# Patient Record
Sex: Male | Born: 1970 | Race: White | Hispanic: No | Marital: Single
Health system: Southern US, Community
[De-identification: ages and names within clinical notes are randomized; demographics above are authoritative.]

## PROBLEM LIST (undated history)

## (undated) HISTORY — PX: CLEFT LIP REPAIR: SUR1164

## (undated) HISTORY — PX: NOSE SURGERY: SHX723

## (undated) HISTORY — PX: MYRINGOTOMY: SUR874

---

## 2000-05-04 ENCOUNTER — Encounter (INDEPENDENT_AMBULATORY_CARE_PROVIDER_SITE_OTHER): Payer: Self-pay | Admitting: Specialist

## 2000-05-04 ENCOUNTER — Ambulatory Visit (HOSPITAL_COMMUNITY): Admission: EM | Admit: 2000-05-04 | Discharge: 2000-05-04 | Payer: Self-pay | Admitting: Emergency Medicine

## 2000-11-30 ENCOUNTER — Emergency Department (HOSPITAL_COMMUNITY): Admission: EM | Admit: 2000-11-30 | Discharge: 2000-11-30 | Payer: Self-pay | Admitting: Emergency Medicine

## 2005-07-09 ENCOUNTER — Emergency Department (HOSPITAL_COMMUNITY): Admission: EM | Admit: 2005-07-09 | Discharge: 2005-07-09 | Payer: Self-pay | Admitting: Emergency Medicine

## 2007-05-15 ENCOUNTER — Emergency Department (HOSPITAL_COMMUNITY): Admission: EM | Admit: 2007-05-15 | Discharge: 2007-05-15 | Payer: Self-pay | Admitting: Family Medicine

## 2007-10-28 ENCOUNTER — Emergency Department (HOSPITAL_COMMUNITY): Admission: EM | Admit: 2007-10-28 | Discharge: 2007-10-28 | Payer: Self-pay | Admitting: Family Medicine

## 2011-05-05 NOTE — Op Note (Signed)
[facility]  Patient:    Adam Mcgrath, Adam Mcgrath                          MRN: [id number] Proc. Date: 05/04/00 Adm. Date:  [id number] Disc. Date: [id number] Attending:  Benny Lennert CC:         Juluis Mire, M.D.                           Operative Report  PREOPERATIVE DIAGNOSIS:  Large cyst of scalp.  POSTOPERATIVE DIAGNOSIS:  Large cyst of scalp.  OPERATION PERFORMED:  Excision of scalp cyst.  SURGEON:  Marnee Spring. Wiliam Ke, M.D.  ASSISTANT:  None.  ANESTHESIA:  Local MAC.  DESCRIPTION OF PROCEDURE:  With the patient somewhat sedated lying on his stomach, the skin of the posterior scalp was prepped and draped in the usual manner.  An excision of a sizable ellipse of skin in the midst of the large cyst was made in a vertical direction on the parietal scalp.  This was deepened through subcutaneous tissues.  Tissues had been infiltrated with a mixture of Xylocaine and Marcaine anesthesia.  The scalp cyst was excised in total intact and hemostasis obtained with electrocautery current.  The wound was then closed with horizontal mattress sutures of 3-0 nylon.  It was dressed with collodion.  Estimated blood loss for this procedure was minimal.  The patient received no blood and left the operating room in stable condition after sponge and needle counts were verified. DD:  05/04/00 TD:  05/09/00 Job: [id number]

## 2011-07-04 ENCOUNTER — Ambulatory Visit (INDEPENDENT_AMBULATORY_CARE_PROVIDER_SITE_OTHER): Payer: Self-pay

## 2011-07-04 ENCOUNTER — Inpatient Hospital Stay (INDEPENDENT_AMBULATORY_CARE_PROVIDER_SITE_OTHER)
Admission: RE | Admit: 2011-07-04 | Discharge: 2011-07-04 | Disposition: A | Discharge status: Home / Self Care | Payer: Self-pay | Source: Ambulatory Visit | Attending: Family Medicine | Admitting: Family Medicine

## 2011-07-04 DIAGNOSIS — S20229A Contusion of unspecified back wall of thorax, initial encounter: Secondary | ICD-10-CM

## 2013-08-10 ENCOUNTER — Encounter (HOSPITAL_COMMUNITY): Payer: Self-pay | Admitting: Cardiology

## 2013-08-10 ENCOUNTER — Emergency Department (HOSPITAL_COMMUNITY): Payer: Self-pay

## 2013-08-10 ENCOUNTER — Emergency Department (INDEPENDENT_AMBULATORY_CARE_PROVIDER_SITE_OTHER)
Admission: EM | Admit: 2013-08-10 | Discharge: 2013-08-10 | Disposition: A | Discharge status: Nursing Home (Includes ICF) | Payer: Self-pay | Source: Home / Self Care | Attending: Emergency Medicine | Admitting: Emergency Medicine

## 2013-08-10 ENCOUNTER — Emergency Department (HOSPITAL_COMMUNITY)
Admission: EM | Admit: 2013-08-10 | Discharge: 2013-08-10 | Disposition: A | Discharge status: Home / Self Care | Payer: Self-pay | Attending: Emergency Medicine | Admitting: Emergency Medicine

## 2013-08-10 ENCOUNTER — Encounter (HOSPITAL_COMMUNITY): Payer: Self-pay | Admitting: *Deleted

## 2013-08-10 DIAGNOSIS — R112 Nausea with vomiting, unspecified: Secondary | ICD-10-CM | POA: Insufficient documentation

## 2013-08-10 DIAGNOSIS — S0990XA Unspecified injury of head, initial encounter: Secondary | ICD-10-CM | POA: Insufficient documentation

## 2013-08-10 DIAGNOSIS — M549 Dorsalgia, unspecified: Secondary | ICD-10-CM

## 2013-08-10 DIAGNOSIS — R519 Headache, unspecified: Secondary | ICD-10-CM

## 2013-08-10 DIAGNOSIS — R51 Headache: Secondary | ICD-10-CM

## 2013-08-10 DIAGNOSIS — IMO0002 Reserved for concepts with insufficient information to code with codable children: Secondary | ICD-10-CM | POA: Insufficient documentation

## 2013-08-10 DIAGNOSIS — Y92410 Unspecified street and highway as the place of occurrence of the external cause: Secondary | ICD-10-CM | POA: Insufficient documentation

## 2013-08-10 DIAGNOSIS — Y9389 Activity, other specified: Secondary | ICD-10-CM | POA: Insufficient documentation

## 2013-08-10 DIAGNOSIS — F17200 Nicotine dependence, unspecified, uncomplicated: Secondary | ICD-10-CM | POA: Insufficient documentation

## 2013-08-10 DIAGNOSIS — R0602 Shortness of breath: Secondary | ICD-10-CM

## 2013-08-10 LAB — COMPREHENSIVE METABOLIC PANEL
Albumin: 3.9 g/dL (ref 3.5–5.2)
BUN: 7 mg/dL (ref 6–23)
CO2: 27 mEq/L (ref 19–32)
Chloride: 102 mEq/L (ref 96–112)
Creatinine, Ser: 0.82 mg/dL (ref 0.50–1.35)
GFR calc Af Amer: >90 mL/min (ref >90)
GFR calc non Af Amer: >90 mL/min (ref >90)
Glucose, Bld: 99 mg/dL (ref 70–99)
Total Bilirubin: 0.3 mg/dL (ref 0.3–1.2)

## 2013-08-10 LAB — CBC WITH DIFFERENTIAL/PLATELET
Basophils Relative: 0 % (ref 0–1)
HCT: 40.7 % (ref 39.0–52.0)
Hemoglobin: 13.7 g/dL (ref 13.0–17.0)
MCH: 31.3 pg (ref 26.0–34.0)
MCHC: 33.7 g/dL (ref 30.0–36.0)
Monocytes Absolute: 0.5 10*3/uL (ref 0.1–1.0)
Monocytes Relative: 5 % (ref 3–12)
Neutro Abs: 7.4 10*3/uL (ref 1.7–7.7)

## 2013-08-10 LAB — URINALYSIS, ROUTINE W REFLEX MICROSCOPIC
Bilirubin Urine: NEGATIVE
Glucose, UA: NEGATIVE mg/dL
Ketones, ur: NEGATIVE mg/dL
Leukocytes, UA: NEGATIVE
pH: 6.5 (ref 5.0–8.0)

## 2013-08-10 MED ORDER — OXYCODONE-ACETAMINOPHEN 5-325 MG PO TABS
2.0000 | ORAL_TABLET | Freq: Once | ORAL | Status: AC
  Administered 2013-08-10: 2 via ORAL
  Filled 2013-08-10: qty 2

## 2013-08-10 MED ORDER — METHOCARBAMOL 500 MG PO TABS: 500.0000 mg | ORAL_TABLET | Freq: Two times a day (BID) | ORAL | Status: DC

## 2013-08-10 MED ORDER — KETOROLAC TROMETHAMINE 30 MG/ML IJ SOLN
30.0000 mg | Freq: Once | INTRAMUSCULAR | Status: AC
  Administered 2013-08-10: 30 mg via INTRAVENOUS
  Filled 2013-08-10: qty 1

## 2013-08-10 MED ORDER — IBUPROFEN 600 MG PO TABS: 600.0000 mg | ORAL_TABLET | Freq: Four times a day (QID) | ORAL | Status: DC | PRN

## 2013-08-10 MED ORDER — HYDROCODONE-ACETAMINOPHEN 5-325 MG PO TABS: 2.0000 | ORAL_TABLET | Freq: Four times a day (QID) | ORAL | Status: DC | PRN

## 2013-08-10 MED ORDER — IOHEXOL 300 MG/ML  SOLN
100.0000 mL | Freq: Once | INTRAMUSCULAR | Status: AC | PRN
  Administered 2013-08-10: 100 mL via INTRAVENOUS
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
## 2013-08-10 NOTE — ED Notes (Signed)
PA at bedside.

## 2013-08-10 NOTE — ED Notes (Signed)
Asked for urine sample.
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
## 2013-08-10 NOTE — ED Notes (Signed)
Patient transported to X-ray 

## 2013-08-10 NOTE — ED Notes (Signed)
Patient returned from X-ray 
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
## 2013-08-10 NOTE — ED Provider Notes (Signed)
Medical screening examination/treatment/procedure(s) were performed by non-physician practitioner and as supervising physician I was immediately available for consultation/collaboration.  Leslee Home, M.D.  Reuben Likes, MD 08/10/13 [phone]

## 2013-08-10 NOTE — ED Notes (Signed)
Dahlia Client PA talked with patient; now agreeing to finish scans and xrays. Notified radiology that he needs to be scanned.

## 2013-08-10 NOTE — ED Notes (Addendum)
Radiology called - Pt is refusing C spine xray and CT of head. Micheline Maze, MD notified.

## 2013-08-11 NOTE — ED Provider Notes (Signed)
Medical screening examination/treatment/procedure(s) were performed by non-physician practitioner and as supervising physician I was immediately available for consultation/collaboration.   Shanna Cisco, MD 08/11/13 1221

## 2021-04-10 ENCOUNTER — Emergency Department (HOSPITAL_COMMUNITY): Payer: Medicaid Other | Admitting: Certified Registered"

## 2021-04-10 ENCOUNTER — Encounter (HOSPITAL_COMMUNITY)
Admission: EM | Disposition: A | Discharge status: Home / Self Care | Payer: Self-pay | Source: Home / Self Care | Attending: Orthopaedic Surgery

## 2021-04-10 ENCOUNTER — Emergency Department (HOSPITAL_COMMUNITY): Payer: Medicaid Other

## 2021-04-10 ENCOUNTER — Encounter (HOSPITAL_COMMUNITY): Payer: Self-pay | Admitting: Surgery

## 2021-04-10 ENCOUNTER — Inpatient Hospital Stay (HOSPITAL_COMMUNITY)
Admission: EM | Admit: 2021-04-10 | Discharge: 2021-04-11 | DRG: 481 | Disposition: A | Discharge status: Home / Self Care | Payer: Medicaid Other | Attending: Orthopaedic Surgery | Admitting: Orthopaedic Surgery

## 2021-04-10 DIAGNOSIS — S7221XA Displaced subtrochanteric fracture of right femur, initial encounter for closed fracture: Secondary | ICD-10-CM | POA: Diagnosis present

## 2021-04-10 DIAGNOSIS — D62 Acute posthemorrhagic anemia: Secondary | ICD-10-CM | POA: Diagnosis not present

## 2021-04-10 DIAGNOSIS — Z8781 Personal history of (healed) traumatic fracture: Secondary | ICD-10-CM

## 2021-04-10 DIAGNOSIS — S72141A Displaced intertrochanteric fracture of right femur, initial encounter for closed fracture: Secondary | ICD-10-CM | POA: Diagnosis present

## 2021-04-10 DIAGNOSIS — F10129 Alcohol abuse with intoxication, unspecified: Secondary | ICD-10-CM | POA: Diagnosis present

## 2021-04-10 DIAGNOSIS — Z20822 Contact with and (suspected) exposure to covid-19: Secondary | ICD-10-CM | POA: Diagnosis present

## 2021-04-10 DIAGNOSIS — Z9889 Other specified postprocedural states: Secondary | ICD-10-CM

## 2021-04-10 DIAGNOSIS — S72351A Displaced comminuted fracture of shaft of right femur, initial encounter for closed fracture: Secondary | ICD-10-CM

## 2021-04-10 HISTORY — PX: FEMUR IM NAIL: SHX1597

## 2021-04-10 LAB — I-STAT CHEM 8, ED
BUN: 13 mg/dL (ref 6–20)
Calcium, Ion: 1.1 mmol/L — ABNORMAL LOW (ref 1.15–1.40)
Chloride: 106 mmol/L (ref 98–111)
Creatinine, Ser: 0.9 mg/dL (ref 0.61–1.24)
Glucose, Bld: 90 mg/dL (ref 70–99)
HCT: 37 % — ABNORMAL LOW (ref 39.0–52.0)
Hemoglobin: 12.6 g/dL — ABNORMAL LOW (ref 13.0–17.0)
Potassium: 3.4 mmol/L — ABNORMAL LOW (ref 3.5–5.1)
Sodium: 140 mmol/L (ref 135–145)
TCO2: 22 mmol/L (ref 22–32)

## 2021-04-10 LAB — COMPREHENSIVE METABOLIC PANEL
ALT: 11 U/L (ref 0–44)
AST: 20 U/L (ref 15–41)
Albumin: 3.9 g/dL (ref 3.5–5.0)
Alkaline Phosphatase: 88 U/L (ref 38–126)
Anion gap: 10 (ref 5–15)
BUN: 13 mg/dL (ref 6–20)
CO2: 22 mmol/L (ref 22–32)
Calcium: 8.6 mg/dL — ABNORMAL LOW (ref 8.9–10.3)
Chloride: 105 mmol/L (ref 98–111)
Creatinine, Ser: 0.79 mg/dL (ref 0.61–1.24)
GFR, Estimated: >60 mL/min (ref >60)
Glucose, Bld: 93 mg/dL (ref 70–99)
Potassium: 3.5 mmol/L (ref 3.5–5.1)
Sodium: 137 mmol/L (ref 135–145)
Total Bilirubin: 0.7 mg/dL (ref 0.3–1.2)
Total Protein: 6.7 g/dL (ref 6.5–8.1)

## 2021-04-10 LAB — CBC
HCT: 38.4 % — ABNORMAL LOW (ref 39.0–52.0)
Hemoglobin: 12.3 g/dL — ABNORMAL LOW (ref 13.0–17.0)
MCH: 31.4 pg (ref 26.0–34.0)
MCHC: 32 g/dL (ref 30.0–36.0)
MCV: 98 fL (ref 80.0–100.0)
Platelets: 331 10*3/uL (ref 150–400)
RBC: 3.92 MIL/uL — ABNORMAL LOW (ref 4.22–5.81)
RDW: 15.2 % (ref 11.5–15.5)
WBC: 17.8 10*3/uL — ABNORMAL HIGH (ref 4.0–10.5)
nRBC: 0 % (ref 0.0–0.2)

## 2021-04-10 LAB — RESP PANEL BY RT-PCR (FLU A&B, COVID) ARPGX2
Influenza A by PCR: NEGATIVE
Influenza B by PCR: NEGATIVE
SARS Coronavirus 2 by RT PCR: NEGATIVE

## 2021-04-10 LAB — PROTIME-INR
INR: 0.9 (ref 0.8–1.2)
Prothrombin Time: 12.4 seconds (ref 11.4–15.2)

## 2021-04-10 LAB — SAMPLE TO BLOOD BANK

## 2021-04-10 LAB — LACTIC ACID, PLASMA: Lactic Acid, Venous: 1.5 mmol/L (ref 0.5–1.9)

## 2021-04-10 LAB — ETHANOL: Alcohol, Ethyl (B): 84 mg/dL — ABNORMAL HIGH (ref <10)

## 2021-04-10 SURGERY — INSERTION, INTRAMEDULLARY ROD, FEMUR
Anesthesia: General | Site: Hip | Laterality: Right

## 2021-04-10 MED ORDER — PROMETHAZINE HCL 25 MG/ML IJ SOLN: 6.2500 mg | INTRAMUSCULAR | Status: DC | PRN

## 2021-04-10 MED ORDER — ONDANSETRON HCL 4 MG PO TABS: 4.0000 mg | ORAL_TABLET | Freq: Four times a day (QID) | ORAL | Status: DC | PRN

## 2021-04-10 MED ORDER — HYDROMORPHONE HCL 1 MG/ML IJ SOLN
INTRAMUSCULAR | Status: AC
  Administered 2021-04-10: 1 mg via INTRAVENOUS
  Filled 2021-04-10: qty 1

## 2021-04-10 MED ORDER — SODIUM CHLORIDE 0.9 % IV SOLN: INTRAVENOUS | Status: DC

## 2021-04-10 MED ORDER — TRANEXAMIC ACID-NACL 1000-0.7 MG/100ML-% IV SOLN: 1000.0000 mg | Freq: Once | INTRAVENOUS | Status: DC

## 2021-04-10 MED ORDER — PHENYLEPHRINE 40 MCG/ML (10ML) SYRINGE FOR IV PUSH (FOR BLOOD PRESSURE SUPPORT)
PREFILLED_SYRINGE | INTRAVENOUS | Status: AC
  Filled 2021-04-10: qty 10

## 2021-04-10 MED ORDER — CEFAZOLIN SODIUM-DEXTROSE 2-4 GM/100ML-% IV SOLN: 2.0000 g | INTRAVENOUS | Status: DC

## 2021-04-10 MED ORDER — HYDROMORPHONE HCL 1 MG/ML IJ SOLN: 1.0000 mg | Freq: Once | INTRAMUSCULAR | Status: AC

## 2021-04-10 MED ORDER — ACETAMINOPHEN 500 MG PO TABS
1000.0000 mg | ORAL_TABLET | Freq: Four times a day (QID) | ORAL | Status: AC
  Administered 2021-04-10 – 2021-04-11 (×4): 1000 mg via ORAL
  Filled 2021-04-10 (×4): qty 2

## 2021-04-10 MED ORDER — ENOXAPARIN SODIUM 40 MG/0.4ML ~~LOC~~ SOLN
40.0000 mg | SUBCUTANEOUS | Status: DC
  Administered 2021-04-11: 40 mg via SUBCUTANEOUS
  Filled 2021-04-10: qty 0.4

## 2021-04-10 MED ORDER — MIDAZOLAM HCL 2 MG/2ML IJ SOLN
INTRAMUSCULAR | Status: AC
  Filled 2021-04-10: qty 2

## 2021-04-10 MED ORDER — FENTANYL CITRATE (PF) 250 MCG/5ML IJ SOLN
INTRAMUSCULAR | Status: AC
  Filled 2021-04-10: qty 5

## 2021-04-10 MED ORDER — EPHEDRINE SULFATE-NACL 50-0.9 MG/10ML-% IV SOSY
PREFILLED_SYRINGE | INTRAVENOUS | Status: DC | PRN
  Administered 2021-04-10: 5 mg via INTRAVENOUS
  Administered 2021-04-10: 10 mg via INTRAVENOUS
  Administered 2021-04-10 (×2): 5 mg via INTRAVENOUS

## 2021-04-10 MED ORDER — CEFAZOLIN SODIUM-DEXTROSE 2-4 GM/100ML-% IV SOLN
INTRAVENOUS | Status: AC
  Filled 2021-04-10: qty 100

## 2021-04-10 MED ORDER — METHOCARBAMOL 500 MG PO TABS: 500.0000 mg | ORAL_TABLET | Freq: Four times a day (QID) | ORAL | Status: DC | PRN

## 2021-04-10 MED ORDER — ONDANSETRON HCL 4 MG/2ML IJ SOLN: 4.0000 mg | Freq: Four times a day (QID) | INTRAMUSCULAR | Status: DC | PRN

## 2021-04-10 MED ORDER — CEFAZOLIN SODIUM-DEXTROSE 2-4 GM/100ML-% IV SOLN
2.0000 g | Freq: Four times a day (QID) | INTRAVENOUS | Status: AC
  Administered 2021-04-10 – 2021-04-11 (×3): 2 g via INTRAVENOUS
  Filled 2021-04-10 (×3): qty 100

## 2021-04-10 MED ORDER — MIDAZOLAM HCL 5 MG/5ML IJ SOLN
INTRAMUSCULAR | Status: DC | PRN
  Administered 2021-04-10: 2 mg via INTRAVENOUS

## 2021-04-10 MED ORDER — LACTATED RINGERS IV SOLN: INTRAVENOUS | Status: DC | PRN

## 2021-04-10 MED ORDER — TRANEXAMIC ACID-NACL 1000-0.7 MG/100ML-% IV SOLN
INTRAVENOUS | Status: AC
  Filled 2021-04-10: qty 100

## 2021-04-10 MED ORDER — DEXAMETHASONE SODIUM PHOSPHATE 10 MG/ML IJ SOLN
INTRAMUSCULAR | Status: AC
  Filled 2021-04-10: qty 1

## 2021-04-10 MED ORDER — 0.9 % SODIUM CHLORIDE (POUR BTL) OPTIME
TOPICAL | Status: DC | PRN
  Administered 2021-04-10: 1000 mL

## 2021-04-10 MED ORDER — TRANEXAMIC ACID-NACL 1000-0.7 MG/100ML-% IV SOLN
1000.0000 mg | INTRAVENOUS | Status: AC
  Administered 2021-04-10: 1000 mg via INTRAVENOUS

## 2021-04-10 MED ORDER — PHENYLEPHRINE HCL-NACL 10-0.9 MG/250ML-% IV SOLN
INTRAVENOUS | Status: DC | PRN
  Administered 2021-04-10 (×2): 30 ug/min via INTRAVENOUS

## 2021-04-10 MED ORDER — FENTANYL CITRATE (PF) 100 MCG/2ML IJ SOLN
INTRAMUSCULAR | Status: DC | PRN
  Administered 2021-04-10 (×2): 50 ug via INTRAVENOUS

## 2021-04-10 MED ORDER — IOHEXOL 300 MG/ML  SOLN: 100.0000 mL | Freq: Once | INTRAMUSCULAR | Status: DC | PRN

## 2021-04-10 MED ORDER — SORBITOL 70 % SOLN
30.0000 mL | Freq: Every day | Status: DC | PRN
  Filled 2021-04-10: qty 30

## 2021-04-10 MED ORDER — OXYCODONE HCL 5 MG PO TABS
10.0000 mg | ORAL_TABLET | ORAL | Status: DC | PRN
  Administered 2021-04-10 – 2021-04-11 (×2): 15 mg via ORAL
  Filled 2021-04-10 (×2): qty 3

## 2021-04-10 MED ORDER — PHENYLEPHRINE 40 MCG/ML (10ML) SYRINGE FOR IV PUSH (FOR BLOOD PRESSURE SUPPORT)
PREFILLED_SYRINGE | INTRAVENOUS | Status: DC | PRN
  Administered 2021-04-10 (×3): 120 ug via INTRAVENOUS
  Administered 2021-04-10: 80 ug via INTRAVENOUS

## 2021-04-10 MED ORDER — FENTANYL CITRATE (PF) 100 MCG/2ML IJ SOLN
INTRAMUSCULAR | Status: AC
  Filled 2021-04-10: qty 2

## 2021-04-10 MED ORDER — HYDROMORPHONE HCL 1 MG/ML IJ SOLN
0.5000 mg | INTRAMUSCULAR | Status: DC | PRN
  Administered 2021-04-10: 1 mg via INTRAVENOUS
  Filled 2021-04-10: qty 1

## 2021-04-10 MED ORDER — DEXAMETHASONE SODIUM PHOSPHATE 4 MG/ML IJ SOLN
INTRAMUSCULAR | Status: DC | PRN
  Administered 2021-04-10: 4 mg via INTRAVENOUS

## 2021-04-10 MED ORDER — FENTANYL CITRATE (PF) 100 MCG/2ML IJ SOLN
25.0000 ug | INTRAMUSCULAR | Status: DC | PRN
  Administered 2021-04-10: 50 ug via INTRAVENOUS

## 2021-04-10 MED ORDER — PHENOL 1.4 % MT LIQD: 1.0000 | OROMUCOSAL | Status: DC | PRN

## 2021-04-10 MED ORDER — OXYCODONE HCL 5 MG PO TABS
5.0000 mg | ORAL_TABLET | ORAL | Status: DC | PRN
Start: 2021-04-10 — End: 2021-04-11

## 2021-04-10 MED ORDER — ONDANSETRON HCL 4 MG/2ML IJ SOLN
INTRAMUSCULAR | Status: AC
  Filled 2021-04-10: qty 2

## 2021-04-10 MED ORDER — DOCUSATE SODIUM 100 MG PO CAPS
100.0000 mg | ORAL_CAPSULE | Freq: Two times a day (BID) | ORAL | Status: DC
  Administered 2021-04-10 (×2): 100 mg via ORAL
  Filled 2021-04-10 (×3): qty 1

## 2021-04-10 MED ORDER — PROPOFOL 10 MG/ML IV BOLUS
INTRAVENOUS | Status: DC | PRN
  Administered 2021-04-10: 120 mg via INTRAVENOUS

## 2021-04-10 MED ORDER — LIDOCAINE 2% (20 MG/ML) 5 ML SYRINGE
INTRAMUSCULAR | Status: AC
  Filled 2021-04-10: qty 5

## 2021-04-10 MED ORDER — SUCCINYLCHOLINE CHLORIDE 200 MG/10ML IV SOSY
PREFILLED_SYRINGE | INTRAVENOUS | Status: AC
  Filled 2021-04-10: qty 10

## 2021-04-10 MED ORDER — CHLORHEXIDINE GLUCONATE 0.12 % MT SOLN
OROMUCOSAL | Status: AC
  Filled 2021-04-10: qty 15

## 2021-04-10 MED ORDER — ROCURONIUM BROMIDE 10 MG/ML (PF) SYRINGE
PREFILLED_SYRINGE | INTRAVENOUS | Status: AC
  Filled 2021-04-10: qty 10

## 2021-04-10 MED ORDER — PROPOFOL 10 MG/ML IV BOLUS
INTRAVENOUS | Status: AC
  Filled 2021-04-10: qty 20

## 2021-04-10 MED ORDER — ACETAMINOPHEN 10 MG/ML IV SOLN
INTRAVENOUS | Status: AC
  Filled 2021-04-10: qty 100

## 2021-04-10 MED ORDER — SUGAMMADEX SODIUM 200 MG/2ML IV SOLN
INTRAVENOUS | Status: DC | PRN
  Administered 2021-04-10: 150 mg via INTRAVENOUS

## 2021-04-10 MED ORDER — POLYETHYLENE GLYCOL 3350 17 G PO PACK: 17.0000 g | PACK | Freq: Every day | ORAL | Status: DC | PRN

## 2021-04-10 MED ORDER — METHOCARBAMOL 1000 MG/10ML IJ SOLN: 1000.0000 mg | Freq: Once | INTRAMUSCULAR | Status: DC

## 2021-04-10 MED ORDER — ACETAMINOPHEN 10 MG/ML IV SOLN
INTRAVENOUS | Status: DC | PRN
  Administered 2021-04-10: 1000 mg via INTRAVENOUS

## 2021-04-10 MED ORDER — MAGNESIUM CITRATE PO SOLN: 1.0000 | Freq: Once | ORAL | Status: DC | PRN

## 2021-04-10 MED ORDER — CEFAZOLIN SODIUM-DEXTROSE 2-3 GM-%(50ML) IV SOLR
INTRAVENOUS | Status: DC | PRN
  Administered 2021-04-10: 2 g via INTRAVENOUS

## 2021-04-10 MED ORDER — LIDOCAINE HCL (CARDIAC) PF 100 MG/5ML IV SOSY
PREFILLED_SYRINGE | INTRAVENOUS | Status: DC | PRN
  Administered 2021-04-10: 60 mg via INTRAVENOUS

## 2021-04-10 MED ORDER — ALUM & MAG HYDROXIDE-SIMETH 200-200-20 MG/5ML PO SUSP: 30.0000 mL | ORAL | Status: DC | PRN

## 2021-04-10 MED ORDER — METHOCARBAMOL 1000 MG/10ML IJ SOLN
1000.0000 mg | Freq: Once | INTRAVENOUS | Status: AC
  Administered 2021-04-10: 1000 mg via INTRAVENOUS
  Filled 2021-04-10: qty 10

## 2021-04-10 MED ORDER — ONDANSETRON HCL 4 MG/2ML IJ SOLN
INTRAMUSCULAR | Status: DC | PRN
  Administered 2021-04-10: 4 mg via INTRAVENOUS

## 2021-04-10 MED ORDER — HYDROMORPHONE HCL 1 MG/ML IJ SOLN
1.0000 mg | Freq: Once | INTRAMUSCULAR | Status: AC
  Administered 2021-04-10: 1 mg via INTRAVENOUS

## 2021-04-10 MED ORDER — TRANEXAMIC ACID 1000 MG/10ML IV SOLN
2000.0000 mg | INTRAVENOUS | Status: AC
  Filled 2021-04-10: qty 20

## 2021-04-10 MED ORDER — SODIUM CHLORIDE 0.9 % IV BOLUS
1000.0000 mL | Freq: Once | INTRAVENOUS | Status: AC
  Administered 2021-04-10: 1000 mL via INTRAVENOUS

## 2021-04-10 MED ORDER — IOHEXOL 300 MG/ML  SOLN
100.0000 mL | Freq: Once | INTRAMUSCULAR | Status: AC | PRN
  Administered 2021-04-10: 100 mL via INTRAVENOUS

## 2021-04-10 MED ORDER — METHOCARBAMOL 1000 MG/10ML IJ SOLN
500.0000 mg | Freq: Four times a day (QID) | INTRAVENOUS | Status: DC | PRN
  Filled 2021-04-10: qty 5

## 2021-04-10 MED ORDER — TRANEXAMIC ACID-NACL 1000-0.7 MG/100ML-% IV SOLN
1000.0000 mg | Freq: Once | INTRAVENOUS | Status: AC
  Administered 2021-04-10: 1000 mg via INTRAVENOUS
  Filled 2021-04-10: qty 100

## 2021-04-10 MED ORDER — MENTHOL 3 MG MT LOZG: 1.0000 | LOZENGE | OROMUCOSAL | Status: DC | PRN

## 2021-04-10 MED ORDER — ROCURONIUM BROMIDE 100 MG/10ML IV SOLN
INTRAVENOUS | Status: DC | PRN
  Administered 2021-04-10: 50 mg via INTRAVENOUS
  Administered 2021-04-10: 10 mg via INTRAVENOUS
  Administered 2021-04-10: 20 mg via INTRAVENOUS

## 2021-04-10 MED ORDER — ACETAMINOPHEN 325 MG PO TABS: 325.0000 mg | ORAL_TABLET | Freq: Four times a day (QID) | ORAL | Status: DC | PRN

## 2021-04-10 SURGICAL SUPPLY — 41 items
BIT DRILL SHORT 4.0 (BIT) IMPLANT
COVER PERINEAL POST (MISCELLANEOUS) ×3 IMPLANT
COVER SURGICAL LIGHT HANDLE (MISCELLANEOUS) ×3 IMPLANT
DRAPE C-ARMOR (DRAPES) ×3 IMPLANT
DRESSING MEPILEX FLEX 4X4 (GAUZE/BANDAGES/DRESSINGS) IMPLANT
DRILL BIT SHORT 4.0 (BIT) ×6
DRSG MEPILEX BORDER 4X4 (GAUZE/BANDAGES/DRESSINGS) ×3 IMPLANT
DRSG MEPILEX FLEX 4X4 (GAUZE/BANDAGES/DRESSINGS) ×9
DURAPREP 26ML APPLICATOR (WOUND CARE) ×3 IMPLANT
ELECT REM PT RETURN 9FT ADLT (ELECTROSURGICAL) ×3
ELECTRODE REM PT RTRN 9FT ADLT (ELECTROSURGICAL) IMPLANT
GLOVE ECLIPSE 7.0 STRL STRAW (GLOVE) ×3 IMPLANT
GLOVE SKINSENSE NS SZ7.5 (GLOVE) ×2
GLOVE SKINSENSE STRL SZ7.5 (GLOVE) ×1 IMPLANT
GLOVE SURG SYN 7.5  E (GLOVE) ×3
GLOVE SURG SYN 7.5 E (GLOVE) ×1 IMPLANT
GLOVE SURG SYN 7.5 PF PI (GLOVE) ×1 IMPLANT
GLOVE SURG UNDER POLY LF SZ7 (GLOVE) ×3 IMPLANT
GOWN STRL REIN XL XLG (GOWN DISPOSABLE) ×3 IMPLANT
GUIDE PIN 3.2X343 (PIN) ×2
GUIDE PIN 3.2X343MM (PIN) ×6
GUIDE ROD 3.0 (MISCELLANEOUS) ×3
KIT BASIN OR (CUSTOM PROCEDURE TRAY) ×3 IMPLANT
KIT TURNOVER KIT B (KITS) ×3 IMPLANT
NAIL ART 9MMX38CM TRIGEN RT (Nail) ×2 IMPLANT
NS IRRIG 1000ML POUR BTL (IV SOLUTION) ×3 IMPLANT
PACK GENERAL/GYN (CUSTOM PROCEDURE TRAY) ×3 IMPLANT
PAD ARMBOARD 7.5X6 YLW CONV (MISCELLANEOUS) ×10 IMPLANT
PIN GUIDE 3.2X343MM (PIN) IMPLANT
ROD GUIDE 3.0 (MISCELLANEOUS) IMPLANT
SCREW COMP LAG 85MM META TAN (Screw) ×2 IMPLANT
SCREW SET META-TAN 0MM (Screw) ×2 IMPLANT
SCREW TRIGEN LOW PROF 5.0X37.5 (Screw) ×2 IMPLANT
SCREW TRIGEN LOW PROF 5.0X45 (Screw) ×2 IMPLANT
STAPLER VISISTAT 35W (STAPLE) ×3 IMPLANT
SUT VIC AB 0 CT1 27 (SUTURE) ×3
SUT VIC AB 0 CT1 27XBRD ANBCTR (SUTURE) ×1 IMPLANT
SUT VIC AB 2-0 CT1 27 (SUTURE) ×6
SUT VIC AB 2-0 CT1 TAPERPNT 27 (SUTURE) ×1 IMPLANT
SYR BULB IRRIG 60ML STRL (SYRINGE) ×2 IMPLANT
WATER STERILE IRR 1000ML POUR (IV SOLUTION) ×4 IMPLANT

## 2021-04-10 NOTE — Anesthesia Postprocedure Evaluation (Signed)
Anesthesia Post Note  Patient: Adam Mcgrath  Procedure(s) Performed: INTRAMEDULLARY (IM) NAIL FEMORAL (Right Hip)     Patient location during evaluation: PACU Anesthesia Type: General Level of consciousness: awake and alert Pain management: pain level controlled Vital Signs Assessment: post-procedure vital signs reviewed and stable Respiratory status: spontaneous breathing, nonlabored ventilation, respiratory function stable and patient connected to nasal cannula oxygen Cardiovascular status: blood pressure returned to baseline and stable Postop Assessment: no apparent nausea or vomiting Anesthetic complications: no   No complications documented.  Last Vitals:  Vitals:   04/10/21 1015 04/10/21 1030  BP: 114/64 116/83  Pulse: 98 87  Resp: 20 16  Temp: 36.4 C (!) 36.3 C  SpO2: 96% 95%    Last Pain:  Vitals:   04/10/21 1030  TempSrc: Oral  PainSc:                  Cecile Hearing
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
## 2021-04-10 NOTE — Progress Notes (Signed)
Orthopedic Tech Progress Note Patient Details:  Adam Mcgrath 07-24-1971 [id number]  Musculoskeletal Traction Type of Traction: Bucks Skin Traction Traction Location: rle Traction Weight: 10 lbs   Post Interventions Patient Tolerated: Well Instructions Provided: Care of device,Adjustment of device   [name] Post 04/10/2021, 5:10 AM
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
## 2021-04-10 NOTE — ED Notes (Signed)
Report given to [facility] OR RN
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
## 2021-04-10 NOTE — Anesthesia Procedure Notes (Signed)
Procedure Name: Intubation Date/Time: 04/10/2021 7:57 AM Performed by: Sonda Primes, CRNA Pre-anesthesia Checklist: Patient identified, Emergency Drugs available, Suction available and Patient being monitored Patient Re-evaluated:Patient Re-evaluated prior to induction Oxygen Delivery Method: Circle System Utilized Preoxygenation: Pre-oxygenation with 100% oxygen Induction Type: IV induction Ventilation: Mask ventilation without difficulty Laryngoscope Size: Glidescope and 3 Grade View: Grade I Tube type: Oral Tube size: 8.0 mm Number of attempts: 1 Airway Equipment and Method: Stylet and Oral airway Placement Confirmation: ETT inserted through vocal cords under direct vision,  positive ETCO2 and breath sounds checked- equal and bilateral Secured at: 22 cm Tube secured with: Tape Dental Injury: Teeth and Oropharynx as per pre-operative assessment

## 2021-04-10 NOTE — Anesthesia Preprocedure Evaluation (Addendum)
Anesthesia Evaluation  Patient identified by MRN, date of birth, ID band Patient awake    Reviewed: Allergy & Precautions, NPO status , Patient's Chart, lab work & pertinent test results  Airway Mallampati: II  TM Distance: >3 FB Neck ROM: Full    Dental  (+) Dental Advisory Given, Edentulous Upper, Poor Dentition, Missing   Pulmonary neg pulmonary ROS,    Pulmonary exam normal breath sounds clear to auscultation       Cardiovascular negative cardio ROS Normal cardiovascular exam Rhythm:Regular Rate:Normal     Neuro/Psych negative neurological ROS  negative psych ROS   GI/Hepatic negative GI ROS, Neg liver ROS,   Endo/Other  negative endocrine ROS  Renal/GU negative Renal ROS     Musculoskeletal Right FEMUR FRACTURE   Abdominal   Peds  Hematology  (+) Blood dyscrasia, anemia , Plt 331k    Anesthesia Other Findings   Reproductive/Obstetrics                            Anesthesia Physical Anesthesia Plan  ASA: I  Anesthesia Plan: General   Post-op Pain Management:    Induction: Intravenous  PONV Risk Score and Plan: 3 and Midazolam, Dexamethasone and Ondansetron  Airway Management Planned: Oral ETT  Additional Equipment:   Intra-op Plan:   Post-operative Plan: Extubation in OR  Informed Consent: I have reviewed the patients History and Physical, chart, labs and discussed the procedure including the risks, benefits and alternatives for the proposed anesthesia with the patient or authorized representative who has indicated his/her understanding and acceptance.     Dental advisory given  Plan Discussed with: CRNA  Anesthesia Plan Comments:        Anesthesia Quick Evaluation

## 2021-04-10 NOTE — Discharge Instructions (Signed)
° ° °  1. Change dressings as needed °2. May shower but keep incisions covered and dry °3. Take lovenox to prevent blood clots °4. Take stool softeners as needed °5. Take pain meds as needed ° °

## 2021-04-10 NOTE — ED Notes (Signed)
Pt rec'd 1mg  Dilaudid per 0430am order for 10/10 right hip pain. Override cabinet link not linking per MAR. Pt in total has now rec'd 2mg  IV Dilaudid

## 2021-04-10 NOTE — Plan of Care (Signed)
  Problem: Education: Goal: Knowledge of General Education information will improve Description: Including pain rating scale, medication(s)/side effects and non-pharmacologic comfort measures Outcome: Progressing   Problem: Clinical Measurements: Goal: Will remain free from infection Outcome: Progressing   Problem: Coping: Goal: Level of anxiety will decrease Outcome: Progressing   Problem: Elimination: Goal: Will not experience complications related to bowel motility Outcome: Progressing   Problem: Pain Managment: Goal: General experience of comfort will improve Outcome: Progressing   Problem: Safety: Goal: Ability to remain free from injury will improve Outcome: Progressing   
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
## 2021-04-10 NOTE — Progress Notes (Signed)
Pt arrived to unit from PACU via hospital bed accompanied by staff. Pt alert/oriented in no apparent distress. Pt  orientated/situated to room /equipments. Menu guide provided with instructions. Hospital valuables policy has been discussed with no complaints voiced. Bed in lowest position wilth 3 side rails up,call bell/room phone within reach and all wheels locked.

## 2021-04-10 NOTE — Op Note (Signed)
Date of Surgery: 04/10/2021  INDICATIONS: Adam Mcgrath is a 50 y.o.-year-old male who sustained a right intertrochanteric and subtrochanteric femur fracture. The risks and benefits of the procedure discussed with the patient prior to the procedure and all questions were answered; consent was obtained.  PREOPERATIVE DIAGNOSIS:  1.  Displaced right intertrochanteric femur fracture 2.  Displaced right subtrochanteric femur fracture  POSTOPERATIVE DIAGNOSIS: Same   PROCEDURE:  1.  Open treatment of intertrochanteric femur fracture with intramedullary implant.  2.  Open treatment of subtrochanteric femur fracture with intramedullary implant.  SURGEON: N. Glee Arvin, M.D.   ASSIST: Oneal Grout, [place]  ANESTHESIA: general   IV FLUIDS AND URINE: See anesthesia record   ESTIMATED BLOOD LOSS: 150 cc  IMPLANTS: [employer] Metatan 9 x 36, 85/80 compression screws  DRAINS: None.   COMPLICATIONS: see description of procedure.   DESCRIPTION OF PROCEDURE: The patient was brought to the operating room and placed supine on the operating table. The patient's leg had been signed prior to the procedure. The patient had the anesthesia placed by the anesthesiologist. The prep verification and incision time-outs were performed to confirm that this was the correct patient, site, side and location. The patient had an SCD on the opposite lower extremity. The patient did receive antibiotics prior to the incision and was re-dosed during the procedure as needed at indicated intervals. The patient was positioned on the fracture table with the table in traction.  The leg was internally rotated about 10 degrees to bring the subtrochanteric fracture back into alignment.  The medial butterfly fragment of the subtrochanteric fracture remained in good alignment.  The well leg was placed in a scissor position and all bony prominences were well-padded. The patient had the lower extremity prepped and draped in  the standard surgical fashion. The incision was made 4 finger breadths superior to the greater trochanter. A guide pin was inserted into the tip of the greater trochanter under fluoroscopic guidance. An opening reamer was used to gain access to the femoral canal.  A guidewire was advanced down the proximal femur across the intertrochanteric fracture and with a medially directed force on the femoral shaft the subtrochanteric fracture was brought into alignment and the guidewire was passed across the subtrochanteric fracture and down to the distal femoral physeal scar.  The nail length was measured and reaming was performed up to 10.5 mm with adequate chatter.  Then the femoral nail was inserted down the femoral canal to its proper depth. The appropriate version of insertion for the lag screw was found under fluoroscopy. A pin was inserted up the femoral neck through the jig. Then, a second antirotation pin was inserted inferior to the first pin. The length of the lag screw was then measured. The lag screw was inserted as near to center-center in the head as possible. The antirotation pin was then taken out and an interdigitating compression screw was placed in its place.  Compression was visualized on serial xrays.  The setscrew was then placed through the proximal end of the nail in order to create a fixed angle construct.  2 distal interlocking screws were placed using the perfect circle technique.  The wounds were copiously irrigated with saline and the subcutaneous layer closed with 2.0 vicryl and the skin was reapproximated with staples. The wounds were cleaned and dried a final time and a sterile dressing was placed. The hip was taken through a range of motion at the end of the case under  fluoroscopic imaging to visualize the approach-withdraw phenomenon and confirm implant length in the head. The patient was then awakened from anesthesia and taken to the recovery room in stable condition. All counts were  correct at the end of the case.   Adam Mcgrath was necessary for opening, closing, retracting, limb positioning and overall facilitation and completion of the surgery.  POSTOPERATIVE PLAN: The patient will be touchdown weightbearing and will return in 2 weeks for staple removal and the patient will receive DVT prophylaxis based on other medications, activity level, and risk ratio of bleeding to thrombosis.   Adam Reel, MD [facility] 9:16 AM

## 2021-04-10 NOTE — Progress Notes (Signed)
Pt's bag of belongings was brought by PACU stafff nurse, handed over to pt. And pt has no complaints.reiterated to pt with cousin at bedside that valuables belongings should be send to home for safe keeping as facility is not responsible for any losses/damages of such. No complaints voiced.

## 2021-04-10 NOTE — ED Notes (Signed)
Patient transported to CT 
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
## 2021-04-11 ENCOUNTER — Encounter (HOSPITAL_COMMUNITY): Payer: Self-pay | Admitting: Orthopaedic Surgery

## 2021-04-11 LAB — CBC
HCT: 27.1 % — ABNORMAL LOW (ref 39.0–52.0)
Hemoglobin: 8.8 g/dL — ABNORMAL LOW (ref 13.0–17.0)
MCH: 31.9 pg (ref 26.0–34.0)
MCHC: 32.5 g/dL (ref 30.0–36.0)
MCV: 98.2 fL (ref 80.0–100.0)
Platelets: 261 10*3/uL (ref 150–400)
RBC: 2.76 MIL/uL — ABNORMAL LOW (ref 4.22–5.81)
RDW: 15.4 % (ref 11.5–15.5)
WBC: 10.2 10*3/uL (ref 4.0–10.5)
nRBC: 0 % (ref 0.0–0.2)

## 2021-04-11 LAB — BASIC METABOLIC PANEL
Anion gap: 7 (ref 5–15)
BUN: 12 mg/dL (ref 6–20)
CO2: 26 mmol/L (ref 22–32)
Calcium: 7.9 mg/dL — ABNORMAL LOW (ref 8.9–10.3)
Chloride: 106 mmol/L (ref 98–111)
Creatinine, Ser: 1.14 mg/dL (ref 0.61–1.24)
GFR, Estimated: >60 mL/min (ref >60)
Glucose, Bld: 138 mg/dL — ABNORMAL HIGH (ref 70–99)
Potassium: 3.8 mmol/L (ref 3.5–5.1)
Sodium: 139 mmol/L (ref 135–145)

## 2021-04-11 MED ORDER — OXYCODONE-ACETAMINOPHEN 5-325 MG PO TABS: 1.0000 | ORAL_TABLET | Freq: Four times a day (QID) | ORAL | 0 refills | Status: DC | PRN

## 2021-04-11 MED ORDER — ENOXAPARIN SODIUM 40 MG/0.4ML ~~LOC~~ SOLN: 40.0000 mg | SUBCUTANEOUS | 0 refills | Status: DC
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
## 2021-04-11 NOTE — Progress Notes (Addendum)
Subjective: 1 Day Post-Op Procedure(s) (LRB): INTRAMEDULLARY (IM) NAIL FEMORAL (Right) Patient reports pain as mild.    Objective: Vital signs in last 24 hours: Temp:  [97.4 F (36.3 C)-98.4 F (36.9 C)] 97.8 F (36.6 C) (04/25 0754) Pulse Rate:  [87-98] 96 (04/25 0754) Resp:  [16-20] 16 (04/25 0754) BP: (103-116)/(64-83) 109/66 (04/25 0754) SpO2:  [94 %-98 %] 98 % (04/25 0754)  Intake/Output from previous day: 04/24 0701 - 04/25 0700 In: 2070.4 [P.O.:120; I.V.:1390.5; IV Piggyback:559.9] Out: 950 [Urine:850; Blood:100] Intake/Output this shift: No intake/output data recorded.  Recent Labs    04/10/21 0325 04/10/21 0339 04/11/21 0551  HGB 12.3* 12.6* 8.8*   Recent Labs    04/10/21 0325 04/10/21 0339 04/11/21 0551  WBC 17.8*  --  10.2  RBC 3.92*  --  2.76*  HCT 38.4* 37.0* 27.1*  PLT 331  --  261   Recent Labs    04/10/21 0325 04/10/21 0339 04/11/21 0551  NA 137 140 139  K 3.5 3.4* 3.8  CL 105 106 106  CO2 22  --  26  BUN 13 13 12   CREATININE 0.79 0.90 1.14  GLUCOSE 93 90 138*  CALCIUM 8.6*  --  7.9*   Recent Labs    04/10/21 0325  INR 0.9    Neurologically intact Neurovascular intact Sensation intact distally Intact pulses distally Dorsiflexion/Plantar flexion intact Incision: dressing C/D/I No cellulitis present Compartment soft   Assessment/Plan: 1 Day Post-Op Procedure(s) (LRB): INTRAMEDULLARY (IM) NAIL FEMORAL (Right) Advance diet Up with therapy D/C IV fluids Discharge home with home health once cleared by PT (I anticipate this may be tomorrow) TDWB RLE ABLA- mild and stable       04/12/21 04/11/2021, 9:45 AM

## 2021-04-11 NOTE — Progress Notes (Signed)
Discharge packet provided to pt/cousin with instructions. Pt/cousin verbalized understanding of instructions Ortho care has been discussed extensively with no complaints voiced. RW provided as ordered and signed by responsible party.. D/C to home as ordered.Pt's cousin  Is responsible for pt's ride. Pt remains alert/oriented/ impulsive in no apparent distress.

## 2021-04-12 ENCOUNTER — Telehealth: Payer: Self-pay

## 2021-04-12 NOTE — Telephone Encounter (Signed)
What does this message mean?

## 2021-04-12 NOTE — Telephone Encounter (Signed)
Patient called she stated her husband is requesting pt to be set up call back:[phone]

## 2021-04-12 NOTE — Telephone Encounter (Signed)
See message below °

## 2021-04-13 NOTE — Telephone Encounter (Signed)
Sure we can send referral to [facility] for home health PT.

## 2021-04-13 NOTE — Telephone Encounter (Signed)
Requesting physical therapy.

## 2021-04-14 ENCOUNTER — Other Ambulatory Visit: Payer: Self-pay

## 2021-04-14 ENCOUNTER — Telehealth: Payer: Self-pay | Admitting: Orthopaedic Surgery

## 2021-04-14 DIAGNOSIS — M25551 Pain in right hip: Secondary | ICD-10-CM

## 2021-04-14 NOTE — Telephone Encounter (Signed)
Pt would like a call back to talk about ow often to change the wound bandage and if there needs to be something put on them and all that. CB [phone]

## 2021-04-14 NOTE — Telephone Encounter (Signed)
Referral sent to [facility]

## 2021-04-14 NOTE — Telephone Encounter (Signed)
Sent him to Outpatient PT- since he has Medicaid.

## 2021-04-14 NOTE — Telephone Encounter (Signed)
It does not need to be changed if it looks good.

## 2021-04-15 NOTE — Telephone Encounter (Signed)
Lvm for pt to cb to advise 

## 2021-04-18 ENCOUNTER — Telehealth: Payer: Self-pay | Admitting: Orthopaedic Surgery

## 2021-04-18 NOTE — Telephone Encounter (Signed)
Patient's cousin Nelva Bush called advised patient need Rx refilled Oxycodone. The number to contact  Nelva Bush is [phone]

## 2021-04-19 ENCOUNTER — Emergency Department (HOSPITAL_COMMUNITY)
Admission: EM | Admit: 2021-04-19 | Discharge: 2021-04-19 | Disposition: A | Discharge status: Home / Self Care | Payer: Medicaid Other | Attending: Emergency Medicine | Admitting: Emergency Medicine

## 2021-04-19 ENCOUNTER — Emergency Department (HOSPITAL_COMMUNITY): Payer: Medicaid Other

## 2021-04-19 ENCOUNTER — Other Ambulatory Visit: Payer: Self-pay

## 2021-04-19 ENCOUNTER — Encounter (HOSPITAL_COMMUNITY): Payer: Self-pay

## 2021-04-19 ENCOUNTER — Other Ambulatory Visit: Payer: Self-pay | Admitting: Physician Assistant

## 2021-04-19 DIAGNOSIS — F17210 Nicotine dependence, cigarettes, uncomplicated: Secondary | ICD-10-CM | POA: Diagnosis not present

## 2021-04-19 DIAGNOSIS — A084 Viral intestinal infection, unspecified: Secondary | ICD-10-CM | POA: Diagnosis not present

## 2021-04-19 DIAGNOSIS — R1011 Right upper quadrant pain: Secondary | ICD-10-CM

## 2021-04-19 DIAGNOSIS — R1013 Epigastric pain: Secondary | ICD-10-CM | POA: Diagnosis present

## 2021-04-19 LAB — URINALYSIS, ROUTINE W REFLEX MICROSCOPIC
Bilirubin Urine: NEGATIVE
Glucose, UA: NEGATIVE mg/dL
Hgb urine dipstick: NEGATIVE
Ketones, ur: NEGATIVE mg/dL
Leukocytes,Ua: NEGATIVE
Nitrite: NEGATIVE
Protein, ur: NEGATIVE mg/dL
Specific Gravity, Urine: 1.024 (ref 1.005–1.030)
pH: 7 (ref 5.0–8.0)

## 2021-04-19 LAB — COMPREHENSIVE METABOLIC PANEL
ALT: 62 U/L — ABNORMAL HIGH (ref 0–44)
AST: 50 U/L — ABNORMAL HIGH (ref 15–41)
Albumin: 3.9 g/dL (ref 3.5–5.0)
Alkaline Phosphatase: 99 U/L (ref 38–126)
Anion gap: 12 (ref 5–15)
BUN: 19 mg/dL (ref 6–20)
CO2: 22 mmol/L (ref 22–32)
Calcium: 9.5 mg/dL (ref 8.9–10.3)
Chloride: 99 mmol/L (ref 98–111)
Creatinine, Ser: 1.02 mg/dL (ref 0.61–1.24)
GFR, Estimated: >60 mL/min (ref >60)
Glucose, Bld: 205 mg/dL — ABNORMAL HIGH (ref 70–99)
Potassium: 4 mmol/L (ref 3.5–5.1)
Sodium: 133 mmol/L — ABNORMAL LOW (ref 135–145)
Total Bilirubin: 0.4 mg/dL (ref 0.3–1.2)
Total Protein: 7.2 g/dL (ref 6.5–8.1)

## 2021-04-19 LAB — CBC WITH DIFFERENTIAL/PLATELET
Abs Immature Granulocytes: 0.11 10*3/uL — ABNORMAL HIGH (ref 0.00–0.07)
Basophils Absolute: 0.1 10*3/uL (ref 0.0–0.1)
Basophils Relative: 0 %
Eosinophils Absolute: 0 10*3/uL (ref 0.0–0.5)
Eosinophils Relative: 0 %
HCT: 33.9 % — ABNORMAL LOW (ref 39.0–52.0)
Hemoglobin: 11.2 g/dL — ABNORMAL LOW (ref 13.0–17.0)
Immature Granulocytes: 1 %
Lymphocytes Relative: 12 %
Lymphs Abs: 1.8 10*3/uL (ref 0.7–4.0)
MCH: 32 pg (ref 26.0–34.0)
MCHC: 33 g/dL (ref 30.0–36.0)
MCV: 96.9 fL (ref 80.0–100.0)
Monocytes Absolute: 1 10*3/uL (ref 0.1–1.0)
Monocytes Relative: 7 %
Neutro Abs: 12 10*3/uL — ABNORMAL HIGH (ref 1.7–7.7)
Neutrophils Relative %: 80 %
Platelets: 676 10*3/uL — ABNORMAL HIGH (ref 150–400)
RBC: 3.5 MIL/uL — ABNORMAL LOW (ref 4.22–5.81)
RDW: 15.7 % — ABNORMAL HIGH (ref 11.5–15.5)
WBC: 15.1 10*3/uL — ABNORMAL HIGH (ref 4.0–10.5)
nRBC: 0 % (ref 0.0–0.2)

## 2021-04-19 LAB — LIPASE, BLOOD: Lipase: 27 U/L (ref 11–51)

## 2021-04-19 MED ORDER — SODIUM CHLORIDE 0.9 % IV BOLUS
1000.0000 mL | Freq: Once | INTRAVENOUS | Status: AC
  Administered 2021-04-19: 1000 mL via INTRAVENOUS

## 2021-04-19 MED ORDER — OXYCODONE-ACETAMINOPHEN 5-325 MG PO TABS: 1.0000 | ORAL_TABLET | Freq: Three times a day (TID) | ORAL | 0 refills | Status: DC | PRN

## 2021-04-19 MED ORDER — IOHEXOL 300 MG/ML  SOLN
100.0000 mL | Freq: Once | INTRAMUSCULAR | Status: AC | PRN
  Administered 2021-04-19: 100 mL via INTRAVENOUS

## 2021-04-19 MED ORDER — MORPHINE SULFATE (PF) 4 MG/ML IV SOLN
4.0000 mg | Freq: Once | INTRAVENOUS | Status: AC
  Administered 2021-04-19: 4 mg via INTRAVENOUS
  Filled 2021-04-19: qty 1

## 2021-04-19 MED ORDER — ONDANSETRON HCL 4 MG/2ML IJ SOLN
4.0000 mg | Freq: Once | INTRAMUSCULAR | Status: AC
  Administered 2021-04-19: 4 mg via INTRAVENOUS
  Filled 2021-04-19: qty 2

## 2021-04-19 MED ORDER — LIDOCAINE VISCOUS HCL 2 % MT SOLN
15.0000 mL | Freq: Once | OROMUCOSAL | Status: AC
  Administered 2021-04-19: 15 mL via ORAL
  Filled 2021-04-19: qty 15

## 2021-04-19 MED ORDER — ALUM & MAG HYDROXIDE-SIMETH 200-200-20 MG/5ML PO SUSP
30.0000 mL | Freq: Once | ORAL | Status: AC
  Administered 2021-04-19: 30 mL via ORAL
  Filled 2021-04-19: qty 30

## 2021-04-19 MED ORDER — ONDANSETRON 4 MG PO TBDP: 4.0000 mg | ORAL_TABLET | Freq: Three times a day (TID) | ORAL | 0 refills | Status: DC | PRN

## 2021-04-19 NOTE — ED Notes (Signed)
Patient transported to CT 

## 2021-04-19 NOTE — Discharge Instructions (Signed)
  You were evaluated in the Emergency Department and after careful evaluation, we did not find any emergent condition requiring admission or further testing in the hospital.   Your exam/testing today was overall reassuring.  Symptoms seem to be due to  food poisoning.  Please return to the Emergency Department if you experience any worsening of your condition.  Thank you for allowing [place] to be a part of your care. Please speak to your pharmacist about any new medications prescribed today in regards to side effects or interactions with other medications.  Please follow-up with your primary care in the next couple of days, if you do not have when he can go to [facility].  If you have any new worsening concerning symptoms please come into the ER.  You can take the Zofran as needed for your nausea and vomiting, make sure to stay hydrated.  Your platelets were elevated today, I want you to have these rechecked with your PCP in the next couple of days.  Get help right away if you: Have chest pain. Feel extremely weak or you faint. See blood in your vomit. Have vomit that looks like coffee grounds. Have bloody or black stools or stools that look like tar. Have a severe headache, a stiff neck, or both. Have a rash. Have severe pain, cramping, or bloating in your abdomen. Have trouble breathing or you are breathing very quickly. Have a fast heartbeat. Have skin that feels cold and clammy. Feel confused. Have pain when you urinate. Have signs of dehydration, such as: Dark urine, very little urine, or no urine. Cracked lips. Dry mouth. Sunken eyes. Sleepiness. Weakness.

## 2021-04-19 NOTE — ED Provider Notes (Signed)
[facility] EMERGENCY DEPARTMENT Provider Note   CSN: [id number] Arrival date & time: 04/19/21  1130     History Chief Complaint  Patient presents with  . Abdominal Pain    Adam Mcgrath is a 50 y.o. male with no pertinent past medical history that presents emergency department today for abdominal pain.  Patient tells me that he is having epigastric pain, also reports nausea and vomiting. Patient states that epigastric pain feels like a constant sensation, burning in his epigastric area.  Patient states that he is been vomiting nonstop since last night.  Denies any diarrhea.  States that this all started after he ate a [employer] sandwich.  Denies any bloody or bilious emesis.  Denies any fevers.  Denies any pain elsewhere, states that abdominal pain does not radiate anywhere, stays in his epigastric area.  Denies any abdominal surgeries or abdominal disease that he is aware of.  Denies any sick contacts.  Denies any history of GERD or acid reflux, still has his gallbladder.  States the pain is a 10 out of 10, no other complaints at this time.  No chest pain or shortness of breath or back pain.  Denies any difficulty urinating.  HPI     History reviewed. No pertinent past medical history.  Patient Active Problem List   Diagnosis Date Noted  . Displaced intertrochanteric fracture of right femur, initial encounter for closed fracture (HCC) 04/10/2021  . Closed fracture of subtrochanteric section of femur, right, initial encounter (HCC) 04/10/2021  . Closed subtrochanteric fracture of right femur, initial encounter (HCC) 04/10/2021    Past Surgical History:  Procedure Laterality Date  . CLEFT LIP REPAIR    . FEMUR IM NAIL Right 04/10/2021   Procedure: INTRAMEDULLARY (IM) NAIL FEMORAL;  Surgeon: Tarry Kos, MD;  Location: [facility] OR;  Service: Orthopedics;  Laterality: Right;  . MYRINGOTOMY    . NOSE SURGERY         History reviewed. No pertinent family  history.  Social History   Tobacco Use  . Smoking status: Current Every Day Smoker    Packs/day: 1.00    Types: Cigarettes  Substance Use Topics  . Alcohol use: Yes    Comment: occasionally  . Drug use: No    Home Medications Prior to Admission medications   Medication Sig Start Date End Date Taking? Authorizing Provider  enoxaparin (LOVENOX) 40 MG/0.4ML injection Inject 0.4 mLs (40 mg total) into the skin daily for 14 days. 04/12/21 04/26/21  Cristie Hem, PA-C  HYDROcodone-acetaminophen (NORCO) 5-325 MG per tablet Take 2 tablets by mouth every 6 (six) hours as needed for pain. 08/10/13   Roxy Horseman, PA-C  ibuprofen (ADVIL,MOTRIN) 600 MG tablet Take 1 tablet (600 mg total) by mouth every 6 (six) hours as needed for pain. 08/10/13   Roxy Horseman, PA-C  methocarbamol (ROBAXIN) 500 MG tablet Take 1 tablet (500 mg total) by mouth 2 (two) times daily. 08/10/13   Roxy Horseman, PA-C  oxyCODONE-acetaminophen (PERCOCET) 5-325 MG tablet Take 1-2 tablets by mouth every 8 (eight) hours as needed for severe pain. 04/19/21   Cristie Hem, PA-C    Allergies    Patient has no known allergies.  Review of Systems   Review of Systems  Constitutional: Negative for chills, diaphoresis, fatigue and fever.  HENT: Negative for congestion, sore throat and trouble swallowing.   Eyes: Negative for pain and visual disturbance.  Respiratory: Negative for cough, shortness of breath and wheezing.  Cardiovascular: Negative for chest pain, palpitations and leg swelling.  Gastrointestinal: Positive for abdominal pain, nausea and vomiting. Negative for abdominal distention and diarrhea.  Genitourinary: Negative for difficulty urinating.  Musculoskeletal: Negative for back pain, neck pain and neck stiffness.  Skin: Negative for pallor.  Neurological: Negative for dizziness, speech difficulty, weakness and headaches.  Psychiatric/Behavioral: Negative for confusion.    Physical Exam Updated  Vital Signs BP 116/68   Pulse 85   Temp 99.1 F (37.3 C) (Oral)   Resp 19   SpO2 98%   Physical Exam Constitutional:      General: He is in acute distress.     Appearance: Normal appearance. He is not ill-appearing, toxic-appearing or diaphoretic.     Comments: Patient is currently vomiting, nonbilious or bloody emesis, about 200 cc an emesis bag.  HENT:     Mouth/Throat:     Mouth: Mucous membranes are moist.     Pharynx: Oropharynx is clear.  Eyes:     General: No scleral icterus.    Extraocular Movements: Extraocular movements intact.     Pupils: Pupils are equal, round, and reactive to light.  Cardiovascular:     Rate and Rhythm: Normal rate and regular rhythm.     Pulses: Normal pulses.     Heart sounds: Normal heart sounds.  Pulmonary:     Effort: Pulmonary effort is normal. No respiratory distress.     Breath sounds: Normal breath sounds. No stridor. No wheezing, rhonchi or rales.  Chest:     Chest wall: No tenderness.  Abdominal:     General: Abdomen is flat. There is no distension.     Palpations: Abdomen is soft.     Tenderness: There is abdominal tenderness in the right upper quadrant and epigastric area. There is guarding. There is no rebound.  Musculoskeletal:        General: No swelling or tenderness. Normal range of motion.     Cervical back: Normal range of motion and neck supple. No rigidity.     Right lower leg: No edema.     Left lower leg: No edema.  Skin:    General: Skin is warm and dry.     Capillary Refill: Capillary refill takes less than 2 seconds.     Coloration: Skin is not pale.  Neurological:     General: No focal deficit present.     Mental Status: He is alert and oriented to person, place, and time.  Psychiatric:        Mood and Affect: Mood normal.        Behavior: Behavior normal.     ED Results / Procedures / Treatments   Labs (all labs ordered are listed, but only abnormal results are displayed) Labs Reviewed  CBC WITH  DIFFERENTIAL/PLATELET - Abnormal; Notable for the following components:      Result Value   WBC 15.1 (*)    RBC 3.50 (*)    Hemoglobin 11.2 (*)    HCT 33.9 (*)    RDW 15.7 (*)    Platelets 676 (*)    Neutro Abs 12.0 (*)    Abs Immature Granulocytes 0.11 (*)    All other components within normal limits  COMPREHENSIVE METABOLIC PANEL - Abnormal; Notable for the following components:   Sodium 133 (*)    Glucose, Bld 205 (*)    AST 50 (*)    ALT 62 (*)    All other components within normal limits  LIPASE, BLOOD  URINALYSIS,  ROUTINE W REFLEX MICROSCOPIC    EKG None  Radiology CT Abdomen Pelvis W Contrast  Result Date: 04/19/2021 CLINICAL DATA:  Epigastric pain.  Nausea and vomiting. EXAM: CT ABDOMEN AND PELVIS WITH CONTRAST TECHNIQUE: Multidetector CT imaging of the abdomen and pelvis was performed using the standard protocol following bolus administration of intravenous contrast. CONTRAST:  100mL OMNIPAQUE IOHEXOL 300 MG/ML  SOLN COMPARISON:  Abdominal ultrasound 04/19/2021; CT abdomen 04/10/2021 FINDINGS: Lower chest: Unremarkable Hepatobiliary: Unremarkable Pancreas: There is relative hypodensity the pancreatic head with respect to the pancreatic tail, with densities around 58 Hounsfield units in the pancreatic head and up to 95 Hounsfield units in the pancreatic tail. I do not see any masslike expansion of the pancreatic head, and this drop in density between the pancreatic head and tail is chronic from 2014 hence likely due to localized atrophy or accentuated fatty stippling in the pancreatic head relative to the pancreatic tail. Given the long-term stability I am skeptical of this being a manifestation of inflammatory or neoplastic process. Spleen: Unremarkable Adrenals/Urinary Tract: Small hypodense lesions of the left kidney are likely cysts but technically too small to characterize. Both adrenal glands appear normal. 2 mm left kidney lower pole nonobstructive renal calculus. 2 mm right  kidney upper pole nonobstructive renal calculus. No hydronephrosis or hydroureter. Adrenal glands unremarkable. Stomach/Bowel: Wall thickening in proximal loops of jejunum, cannot exclude a proximal enteritis. No overtly dilated bowel. There is some scattered air-fluid levels in the jejunum. Normal appendix. Terminal ileum unremarkable. Formed stool in the colon. Vascular/Lymphatic: Mild abdominal aortic atherosclerotic vascular calcification. Reproductive: Unremarkable Other: No supplemental non-categorized findings. Musculoskeletal: Intertrochanteric right hip fracture. Recent ORIF (04/10/2021) including femoral nail and anti rotation screw noted without complicating feature. Chronic appearing anterior wedging of L1. IMPRESSION: 1. Wall thickening and air-fluid levels in loops of jejunum, suspicious for proximal enteritis. 2. Recent ORIF of the right hip without complicating feature. 3. Bilateral nonobstructive nephrolithiasis. 4.  Aortic Atherosclerosis (ICD10-I70.0). 5. Chronic (at least since 2014) hypodensity in the pancreatic head with respect to the pancreatic tail, felt to be benign and incidental. Electronically Signed   By: Gaylyn RongWalter  Liebkemann M.D.   On: 04/19/2021 15:57   US Abdomen Limited RUQ (LIVER/GB)  Result Date: 04/19/2021 CLINICAL DATA:  One day of right upper quadrant pain EXAM: ULTRASOUND ABDOMEN LIMITED RIGHT UPPER QUADRANT COMPARISON:  CT chest abdomen pelvis April 10, 2021. FINDINGS: Gallbladder: No gallstones or wall thickening visualized. No sonographic Murphy sign noted by sonographer. Common bile duct: Diameter: 3 mm Liver: No focal lesion identified. Diffusely increased parenchymal echogenicity. Portal vein is patent on color Doppler imaging with normal direction of blood flow towards the liver. Other: None. IMPRESSION: 1. The echogenicity of the liver is increased. This is a nonspecific finding but is most commonly seen with fatty infiltration of the liver. There are no overt focal  liver lesions. 2. Normal gallbladder. Electronically Signed   By: Maudry MayhewJeffrey  Waltz MD   On: 04/19/2021 14:56    Procedures Procedures   Medications Ordered in ED Medications  sodium chloride 0.9 % bolus 1,000 mL (0 mLs Intravenous Stopped 04/19/21 1541)  ondansetron (ZOFRAN) injection 4 mg (4 mg Intravenous Given 04/19/21 1419)  morphine 4 MG/ML injection 4 mg (4 mg Intravenous Given 04/19/21 1420)  alum & mag hydroxide-simeth (MAALOX/MYLANTA) 200-200-20 MG/5ML suspension 30 mL (30 mLs Oral Given 04/19/21 1417)    And  lidocaine (XYLOCAINE) 2 % viscous mouth solution 15 mL (15 mLs Oral Given 04/19/21 1417)  iohexol (OMNIPAQUE)  300 MG/ML solution 100 mL (100 mLs Intravenous Contrast Given 04/19/21 1536)    ED Course  I have reviewed the triage vital signs and the nursing notes.  Pertinent labs & imaging results that were available during my care of the patient were reviewed by me and considered in my medical decision making (see chart for details).    MDM Rules/Calculators/A&P                          Tajae L Coykendall is a 50 y.o. male with no pertinent past medical history that presents emergency department today for abdominal pain.  Differential diagnoses considered include gastritis, biliary colic.   Initial interventions IV fluids, Zofran and pain medication.  ECG interpreted by me demonstrated normal sinus rhythm  Labs remarkable for CMP with glucose of 205, elevated LFTs, CBC with white count of 15.1.  Platelets 676, most likely reactive from recent ORF and gastroenteritis.  Hemoglobin appears stable and at baseline.  Right upper quadrant ultrasound without any concerns for cholelithiasis.  CT abdomen pelvis shows enteritis.  Labs are fitting of this..  Most likely viral.  Patient is only had symptoms for 1 day.  Upon reassessment patient has passed p.o. challenge, states that he feels better.  Patient be discharged, symptomatic treatment discussed, patient will follow up with PCP.  Doubt  need for further emergent work up at this time. I explained the diagnosis and have given explicit precautions to return to the ER including for any other new or worsening symptoms. The patient understands and accepts the medical plan as it's been dictated and I have answered their questions. Discharge instructions concerning home care and prescriptions have been given. The patient is STABLE and is discharged to home in good condition.  I discussed this case with my attending physician who cosigned this note including patient's presenting symptoms, physical exam, and planned diagnostics and interventions. Attending physician stated agreement with plan or made changes to plan which were implemented.      Final Clinical Impression(s) / ED Diagnoses Final diagnoses:  RUQ pain  Viral enteritis    Rx / DC Orders ED Discharge Orders    None       Farrel Gordon, PA-C 04/19/21 1931    Little, Ambrose Finland, MD 04/22/21 [phone]

## 2021-04-19 NOTE — ED Notes (Signed)
Pt's family was contacted & updated her ETA to pick him up is approx. 30 minutes from now.

## 2021-04-19 NOTE — ED Provider Notes (Signed)
MSE was initiated and I personally evaluated the patient and placed orders (if any) at  11:23 AM on Apr 19, 2021.  The patient appears stable so that the remainder of the MSE may be completed by another provider.   Chief Complaint: abdominal pain    HPI:  RUQ pain and vomiting since last night.    ROS: abdominal pain, nausea    Physical Exam:  Gen:                Awake, no distress, has been vomiting HEENT:          Atraumatic  Resp:              Normal effort  Cardiac:          Normal rate  Abd:                Epigastric tenderness. MSK:              Moves extremities without difficulty  Neuro:            Speech clear,EOMS intact      Initiation of care has begun. The patient has been counseled on the process, plan, and necessity for staying for the completion/evaluation, and the remainder of the medical screening examination    Farrel Gordon, PA-C 04/19/21 1134    Little, Ambrose Finland, MD 04/22/21 [phone]

## 2021-04-19 NOTE — Telephone Encounter (Signed)
Sent in

## 2021-04-19 NOTE — ED Triage Notes (Signed)
Pt BIB EMS from home due to [facility] abd pain . Pt reports N&V. Pt denies diarrhea.

## 2021-04-26 ENCOUNTER — Ambulatory Visit: Payer: Self-pay

## 2021-04-26 ENCOUNTER — Encounter: Payer: Self-pay | Admitting: Orthopaedic Surgery

## 2021-04-26 ENCOUNTER — Ambulatory Visit (INDEPENDENT_AMBULATORY_CARE_PROVIDER_SITE_OTHER): Payer: Medicaid Other | Admitting: Orthopaedic Surgery

## 2021-04-26 DIAGNOSIS — S72141A Displaced intertrochanteric fracture of right femur, initial encounter for closed fracture: Secondary | ICD-10-CM

## 2021-04-26 NOTE — Progress Notes (Signed)
   Post-Op Visit Note   Patient: Adam Mcgrath           Date of Birth: May 19, 1971           MRN: [id number] Visit Date: 04/26/2021 PCP: Pcp, No   Assessment & Plan:  Chief Complaint:  Chief Complaint  Patient presents with  . Right Hip - Pain   Visit Diagnoses:  1. Displaced intertrochanteric fracture of right femur, initial encounter for closed fracture [facility])     Plan: Patient is a pleasant 50 year old gentleman who comes in today 2 weeks out right hip IM nail from a subtrochanteric femur fracture.  He has been doing well.  He has minimal pain.  He has been noncompliant touchdown weightbearing.  He is ambulating with a walker.  Examination of his right hip reveals well-healing surgical incisions with staples intact.  No evidence of infection or cellulitis.  Calf is soft and nontender.  He is neurovascularly intact distally.  Today, staples were removed and Steri-Strips applied.  I stressed the importance of touchdown weightbearing to the right lower extremity.  I have offered to provide him with crutches but he would like to use his walker.  He will follow-up with [place] in 4 weeks time for repeat evaluation and x-rays of the right femur.  Call with concerns or questions in the meantime.  Follow-Up Instructions: Return in about 4 weeks (around 05/24/2021).   Orders:  Orders Placed This Encounter  Procedures  . XR FEMUR, MIN 2 VIEWS RIGHT   No orders of the defined types were placed in this encounter.   Imaging: XR FEMUR, MIN 2 VIEWS RIGHT  Result Date: 04/26/2021 X-rays demonstrate stable fracture without hardware complication   PMFS History: Patient Active Problem List   Diagnosis Date Noted  . Displaced intertrochanteric fracture of right femur, initial encounter for closed fracture (HCC) 04/10/2021  . Closed fracture of subtrochanteric section of femur, right, initial encounter (HCC) 04/10/2021  . Closed subtrochanteric fracture of right femur, initial encounter (HCC)  04/10/2021   History reviewed. No pertinent past medical history.  History reviewed. No pertinent family history.  Past Surgical History:  Procedure Laterality Date  . CLEFT LIP REPAIR    . FEMUR IM NAIL Right 04/10/2021   Procedure: INTRAMEDULLARY (IM) NAIL FEMORAL;  Surgeon: Tarry Kos, MD;  Location: [facility] OR;  Service: Orthopedics;  Laterality: Right;  . MYRINGOTOMY    . NOSE SURGERY     Social History   Occupational History  . Not on file  Tobacco Use  . Smoking status: Current Every Day Smoker    Packs/day: 1.00    Types: Cigarettes  . Smokeless tobacco: Not on file  Substance and Sexual Activity  . Alcohol use: Yes    Comment: occasionally  . Drug use: No  . Sexual activity: Never

## 2021-04-29 ENCOUNTER — Ambulatory Visit (HOSPITAL_COMMUNITY): Payer: Medicaid Other

## 2021-05-03 ENCOUNTER — Encounter (HOSPITAL_COMMUNITY): Payer: Self-pay

## 2021-05-03 ENCOUNTER — Other Ambulatory Visit: Payer: Self-pay

## 2021-05-03 ENCOUNTER — Ambulatory Visit (HOSPITAL_COMMUNITY): Payer: Medicaid Other | Attending: Orthopaedic Surgery

## 2021-05-03 DIAGNOSIS — M6281 Muscle weakness (generalized): Secondary | ICD-10-CM | POA: Insufficient documentation

## 2021-05-03 DIAGNOSIS — R29898 Other symptoms and signs involving the musculoskeletal system: Secondary | ICD-10-CM | POA: Insufficient documentation

## 2021-05-03 DIAGNOSIS — M25551 Pain in right hip: Secondary | ICD-10-CM | POA: Insufficient documentation

## 2021-05-03 DIAGNOSIS — R262 Difficulty in walking, not elsewhere classified: Secondary | ICD-10-CM | POA: Insufficient documentation
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
## 2021-05-03 NOTE — Patient Instructions (Signed)
Access Code: [email or web address] URL: [email or web address] Date: 05/03/2021 Prepared by: Shary Decamp  Exercises Supine Quad Set - 1 x daily - 7 x weekly - 3 sets - 10 reps Supine Heel Slide with Strap - 1 x daily - 7 x weekly - 3 sets - 10 reps Supine Hip Abduction on Slider - 1 x daily - 7 x weekly - 3 sets - 10 reps Supine Hip Adduction Isometric with Ball - 1 x daily - 7 x weekly - 3 sets - 10 reps

## 2021-05-05 ENCOUNTER — Other Ambulatory Visit: Payer: Self-pay

## 2021-05-05 ENCOUNTER — Encounter: Payer: Self-pay | Admitting: Orthopaedic Surgery

## 2021-05-05 ENCOUNTER — Ambulatory Visit (INDEPENDENT_AMBULATORY_CARE_PROVIDER_SITE_OTHER): Payer: Medicaid Other | Admitting: Orthopaedic Surgery

## 2021-05-05 VITALS — Ht 72.0 in | Wt 140.0 lb

## 2021-05-05 DIAGNOSIS — S72141A Displaced intertrochanteric fracture of right femur, initial encounter for closed fracture: Secondary | ICD-10-CM

## 2021-05-05 MED ORDER — HYDROCODONE-ACETAMINOPHEN 5-325 MG PO TABS: 1.0000 | ORAL_TABLET | Freq: Two times a day (BID) | ORAL | 0 refills | Status: DC | PRN

## 2021-05-05 NOTE — Progress Notes (Signed)
   Post-Op Visit Note   Patient: Adam Mcgrath           Date of Birth: 08/19/1971           MRN: [id number] Visit Date: 05/05/2021 PCP: Pcp, No   Assessment & Plan:  Chief Complaint:  Chief Complaint  Patient presents with  . Right Hip - Follow-up    Right hip IM nailing 04/10/2021   Visit Diagnoses:  1. Displaced intertrochanteric fracture of right femur, initial encounter for closed fracture [facility])     Plan:   Adam Mcgrath comes in today for recheck of his right hip surgical sites.  He says that he felt a knot near the thigh incision.  This has gotten better with ice and rest and compression.  Denies any constitutional symptoms.  Surgical incisions are all well-healed.  No signs of infection.  I sent in prescription for hydrocodone for him to take sparingly.  He needs to continue with touchdown weightbearing and physical therapy.  He will need two-view x-rays of the right femur on return.  Follow-Up Instructions: Return for as scheduled on 05/24/21 at 1:15 pm.   Orders:  No orders of the defined types were placed in this encounter.  Meds ordered this encounter  Medications  . DISCONTD: HYDROcodone-acetaminophen (NORCO) 5-325 MG tablet    Sig: Take 1-2 tablets by mouth 2 (two) times daily as needed.    Dispense:  20 tablet    Refill:  0  . HYDROcodone-acetaminophen (NORCO) 5-325 MG tablet    Sig: Take 1-2 tablets by mouth 2 (two) times daily as needed.    Dispense:  20 tablet    Refill:  0    Imaging: No results found.  PMFS History: Patient Active Problem List   Diagnosis Date Noted  . Displaced intertrochanteric fracture of right femur, initial encounter for closed fracture (HCC) 04/10/2021  . Closed fracture of subtrochanteric section of femur, right, initial encounter (HCC) 04/10/2021  . Closed subtrochanteric fracture of right femur, initial encounter (HCC) 04/10/2021   History reviewed. No pertinent past medical history.  History reviewed. No pertinent family  history.  Past Surgical History:  Procedure Laterality Date  . CLEFT LIP REPAIR    . FEMUR IM NAIL Right 04/10/2021   Procedure: INTRAMEDULLARY (IM) NAIL FEMORAL;  Surgeon: Tarry Kos, MD;  Location: [facility] OR;  Service: Orthopedics;  Laterality: Right;  . MYRINGOTOMY    . NOSE SURGERY     Social History   Occupational History  . Not on file  Tobacco Use  . Smoking status: Current Every Day Smoker    Packs/day: 1.00    Types: Cigarettes  . Smokeless tobacco: Not on file  Substance and Sexual Activity  . Alcohol use: Yes    Comment: occasionally  . Drug use: No  . Sexual activity: Never

## 2021-05-11 ENCOUNTER — Telehealth (HOSPITAL_COMMUNITY): Payer: Self-pay

## 2021-05-11 ENCOUNTER — Ambulatory Visit (HOSPITAL_COMMUNITY): Payer: Medicaid Other

## 2021-05-11 NOTE — Telephone Encounter (Signed)
Lack of Transportation °

## 2021-05-19 ENCOUNTER — Other Ambulatory Visit: Payer: Self-pay

## 2021-05-19 ENCOUNTER — Ambulatory Visit (HOSPITAL_COMMUNITY): Payer: Medicaid Other | Attending: Orthopaedic Surgery

## 2021-05-19 ENCOUNTER — Encounter (HOSPITAL_COMMUNITY): Payer: Self-pay

## 2021-05-19 DIAGNOSIS — M6281 Muscle weakness (generalized): Secondary | ICD-10-CM | POA: Insufficient documentation

## 2021-05-19 DIAGNOSIS — R262 Difficulty in walking, not elsewhere classified: Secondary | ICD-10-CM | POA: Diagnosis not present

## 2021-05-19 DIAGNOSIS — M25551 Pain in right hip: Secondary | ICD-10-CM | POA: Diagnosis present

## 2021-05-19 DIAGNOSIS — R29898 Other symptoms and signs involving the musculoskeletal system: Secondary | ICD-10-CM | POA: Diagnosis present

## 2021-05-19 NOTE — Therapy (Signed)
[facility] [address] [place], [place], [address] Phone: [phone]   Fax:  [phone]  Physical Therapy Treatment  Patient Details  Name: Adam Mcgrath MRN: [id number] Date of Birth: 1971-11-10 Referring Provider (PT): Tarry Kos, MD   Encounter Date: 05/19/2021   PT End of Session - 05/19/21 1509    Visit Number 2    Number of Visits 6    Date for PT Re-Evaluation 06/14/21    Authorization Type Medicaid of [place]    Authorization - Visit Number 1    Authorization - Number of Visits 3    Progress Note Due on Visit 3    PT Start Time 1455   late arrival   PT Stop Time 1533    PT Time Calculation (min) 38 min    Activity Tolerance Patient tolerated treatment well    Behavior During Therapy [facility] for tasks assessed/performed           History reviewed. No pertinent past medical history.  Past Surgical History:  Procedure Laterality Date  . CLEFT LIP REPAIR    . FEMUR IM NAIL Right 04/10/2021   Procedure: INTRAMEDULLARY (IM) NAIL FEMORAL;  Surgeon: Tarry Kos, MD;  Location: [facility] OR;  Service: Orthopedics;  Laterality: Right;  . MYRINGOTOMY    . NOSE SURGERY      There were no vitals filed for this visit.   Subjective Assessment - 05/19/21 1500    Subjective Pt stated he is feeling good today, admits to putting more pressure on LE that TTWB.  Stated he had a fall a couple of days ago, able to get up wihtout any help and was sore following.    Patient Stated Goals "Be able to walk normally"    Currently in Pain? No/denies                             [facility] Adult PT Treatment/Exercise - 05/19/21 0001      Ambulation/Gait   Ambulation/Gait Yes    Ambulation/Gait Assistance 5: Supervision    Ambulation Distance (Feet) 40 Feet    Assistive device Rolling walker    Gait Pattern Step-to pattern    Ambulation Surface Level;Indoor    Gait Comments Reviewed importance of TTWB      Knee/Hip Exercises:  Supine   Quad Sets Strengthening;Right;2 sets;10 reps    Quad Sets Limitations 5" holds    Heel Slides AAROM;Right;10 reps    Straight Leg Raises 2 sets;10 reps      Knee/Hip Exercises: Sidelying   Hip ABduction Left;10 reps    Clams 10x 5" holds BLE                  PT Education - 05/19/21 1503    Education Details Reviewed goals, educated importance of TTWB and healing process.  Educated importance of HEP compliance for maximal benefits, pt admits to not completeing any exercise and has been putting more weight onto foot.    Person(s) Educated Patient    Methods Explanation;Demonstration;Verbal cues    Comprehension Need further instruction            PT Short Term Goals - 05/03/21 1505      PT SHORT TERM GOAL #1   Title Patient will be independent with HEP in order to improve functional outcomes.    Time 3    Period Weeks    Status New  Target Date 05/24/21      PT SHORT TERM GOAL #2   Title Pt will demo compliance with RLE TTWB to facilitate improved healing    Baseline required demonstration and verbal reinforcement for RLE TTWB during gait/transfers    Time 1    Period Weeks    Status New    Target Date 05/10/21      PT SHORT TERM GOAL #3   Title Demo improved RLE strength to 3+/5 all major muscle groups to prepare for gait training    Baseline 3-/5 gross strength RLE    Time 3    Period Weeks    Status New    Target Date 05/24/21             PT Long Term Goals - 05/03/21 1509      PT LONG TERM GOAL #1   Title Patient will demo modified independent ambulation over various surfaces and stairs maintaining requisite RLE WBing status    Baseline supervision for cues in RLE TTWB using RW    Time 6    Period Weeks    Status New    Target Date 06/14/21      PT LONG TERM GOAL #2   Title Demo improved ambulation as evidenced by distance of 250 ft during using LRAD    Baseline 180 ft with supervision using RW    Time 6    Period Weeks     Status New    Target Date 06/14/21                 Plan - 05/19/21 1510    Clinical Impression Statement Began session reviewing importance of TTWB status, reviewed goals and importance of HEP compliance.  Pt stated he has wound on side of knee, encouraged to keep covered and not to soak in bath or pool to prevent infection.  Session focus on hip and quad strengthening, verbal and tactile cueing to improve form and control.    Personal Factors and Comorbidities Behavior Pattern;Comorbidity 1;Time since onset of injury/illness/exacerbation    Comorbidities tobacco abuse    Examination-Activity Limitations Bed Mobility;Bend;Carry;Lift;Toileting;Stand;Stairs;Squat;Locomotion Level;Transfers    Examination-Participation Restrictions Cleaning;Community Activity;Driving;Yard Work;Shop    Stability/Clinical Decision Making Stable/Uncomplicated    Clinical Decision Making Low    Rehab Potential Good    PT Frequency 1x / week    PT Duration 6 weeks    PT Treatment/Interventions ADLs/Self Care Home Management;Aquatic Therapy;Cryotherapy;Electrical Stimulation;DME Instruction;Gait training;Stair training;Functional mobility training;Therapeutic activities;Therapeutic exercise;Balance training;Patient/family education;Neuromuscular re-education;Manual techniques;Passive range of motion    PT Next Visit Plan F/U wiht WB status following MD apt.  Continue with supine/seated LE ther ex, curb/stair negotiation    PT Home Exercise Plan QS, heel slides, AAROM hip abd, gait with TTWB; 6/2: SLR    Consulted and Agree with Plan of Care Patient           Patient will benefit from skilled therapeutic intervention in order to improve the following deficits and impairments:  Abnormal gait,Decreased activity tolerance,Decreased balance,Decreased mobility,Decreased endurance,Decreased range of motion,Decreased strength,Difficulty walking,Improper body mechanics,Pain  Visit Diagnosis: Difficulty in walking,  not elsewhere classified  Muscle weakness (generalized)  Other symptoms and signs involving the musculoskeletal system  Right hip pain     Problem List Patient Active Problem List   Diagnosis Date Noted  . Displaced intertrochanteric fracture of right femur, initial encounter for closed fracture (HCC) 04/10/2021  . Closed fracture of subtrochanteric section of femur, right, initial encounter (HCC)  04/10/2021  . Closed subtrochanteric fracture of right femur, initial encounter [facility]) 04/10/2021   Becky Sax, LPTA/CLT; CBIS [phone]  Juel Burrow 05/19/2021, 5:31 PM  [place] [facility] [address] [place], [place], [address] Phone: [phone]   Fax:  [phone]  Name: Adam Mcgrath MRN: [id number] Date of Birth: May 29, 1971

## 2021-05-19 NOTE — Patient Instructions (Addendum)
Straight Leg Raise    Tighten stomach and slowly raise locked right leg ____ inches from floor. Repeat 10 times per set. Do 3 sets per session. Do 2 sessions per day.  http://orth.exer.us/1102   Copyright  VHI. All rights reserved.

## 2021-05-24 ENCOUNTER — Other Ambulatory Visit: Payer: Self-pay | Admitting: Orthopaedic Surgery

## 2021-05-24 ENCOUNTER — Ambulatory Visit (INDEPENDENT_AMBULATORY_CARE_PROVIDER_SITE_OTHER): Payer: Medicaid Other

## 2021-05-24 ENCOUNTER — Ambulatory Visit (INDEPENDENT_AMBULATORY_CARE_PROVIDER_SITE_OTHER): Payer: Medicaid Other | Admitting: Orthopaedic Surgery

## 2021-05-24 DIAGNOSIS — S72141A Displaced intertrochanteric fracture of right femur, initial encounter for closed fracture: Secondary | ICD-10-CM

## 2021-05-24 MED ORDER — HYDROCODONE-ACETAMINOPHEN 5-325 MG PO TABS: 1.0000 | ORAL_TABLET | Freq: Two times a day (BID) | ORAL | 0 refills | Status: DC | PRN

## 2021-05-24 NOTE — Progress Notes (Signed)
   Post-Op Visit Note   Patient: Adam Mcgrath           Date of Birth: [birth year]           MRN: [id number] Visit Date: 05/24/2021 PCP: Pcp, No   Assessment & Plan:  Chief Complaint:  Chief Complaint  Patient presents with  . Right Leg - Follow-up   Visit Diagnoses:  1. Displaced intertrochanteric fracture of right femur, initial encounter for closed fracture [facility])     Plan: Patient is a pleasant 50 year old gentleman who comes in today 6 weeks out right hip IM nail from a subtrochanteric femur fracture date of surgery 04/10/2021.  He has been doing fairly well.  He has been getting outpatient physical therapy and is currently ambulating with a walker.  He has not been compliant with touchdown weightbearing and notes he has been full weightbearing, walking quite a bit at times.  He is taking Norco as needed for therapy and if he is overdone things.  Examination of his right hip reveals a painless logroll.  He is neurovascular intact distally.  At this point in time, he is radiographically healing and we will allow him to progress to full weightbearing.  He will continue with therapy.  We will provide him with a single-point cane today.  He will follow-up with [place] in 6 weeks time for repeat evaluation and x-rays of the right femur.  Call with concerns or questions in the meantime.  Follow-Up Instructions: Return in about 6 weeks (around 07/05/2021).   Orders:  No orders of the defined types were placed in this encounter.  Meds ordered this encounter  Medications  . HYDROcodone-acetaminophen (NORCO) 5-325 MG tablet    Sig: Take 1-2 tablets by mouth 2 (two) times daily as needed.    Dispense:  20 tablet    Refill:  0    Imaging: XR FEMUR, MIN 2 VIEWS RIGHT  Result Date: 05/24/2021 X-rays demonstrate callus formation to the fracture site.  No evidence of hardware complication.   PMFS History: Patient Active Problem List   Diagnosis Date Noted  . Displaced intertrochanteric  fracture of right femur, initial encounter for closed fracture (HCC) 04/10/2021  . Closed fracture of subtrochanteric section of femur, right, initial encounter (HCC) 04/10/2021  . Closed subtrochanteric fracture of right femur, initial encounter (HCC) 04/10/2021   No past medical history on file.  No family history on file.  Past Surgical History:  Procedure Laterality Date  . CLEFT LIP REPAIR    . FEMUR IM NAIL Right 04/10/2021   Procedure: INTRAMEDULLARY (IM) NAIL FEMORAL;  Surgeon: Tarry Kos, MD;  Location: [facility] OR;  Service: Orthopedics;  Laterality: Right;  . MYRINGOTOMY    . NOSE SURGERY     Social History   Occupational History  . Not on file  Tobacco Use  . Smoking status: Current Every Day Smoker    Packs/day: 1.00    Types: Cigarettes  . Smokeless tobacco: Not on file  Substance and Sexual Activity  . Alcohol use: Yes    Comment: occasionally  . Drug use: No  . Sexual activity: Never

## 2021-05-25 ENCOUNTER — Ambulatory Visit (HOSPITAL_COMMUNITY): Payer: Medicaid Other

## 2021-05-25 ENCOUNTER — Encounter (HOSPITAL_COMMUNITY): Payer: Self-pay

## 2021-05-25 ENCOUNTER — Other Ambulatory Visit: Payer: Self-pay

## 2021-05-25 DIAGNOSIS — R262 Difficulty in walking, not elsewhere classified: Secondary | ICD-10-CM

## 2021-05-25 DIAGNOSIS — R29898 Other symptoms and signs involving the musculoskeletal system: Secondary | ICD-10-CM

## 2021-05-25 DIAGNOSIS — M25551 Pain in right hip: Secondary | ICD-10-CM

## 2021-05-25 DIAGNOSIS — M6281 Muscle weakness (generalized): Secondary | ICD-10-CM

## 2021-05-25 NOTE — Patient Instructions (Signed)
Access Code: [email or web address] URL: [email or web address] Date: 05/25/2021 Prepared by: Shary Decamp  Exercises Supine Straight Leg Raises - 1 x daily - 7 x weekly - 3 sets - 10 reps Sidelying Hip Abduction - 1 x daily - 7 x weekly - 3 sets - 10 reps Prone Hip Extension - 1 x daily - 7 x weekly - 3 sets - 10 reps Standing Hip Abduction Adduction at Pool Wall - 1 x daily - 7 x weekly - 3 sets - 10 reps Standing Hip Flexion Extension at [employer] - 1 x daily - 7 x weekly - 3 sets - 10 reps

## 2021-05-25 NOTE — Therapy (Signed)
[facility] [address] [place], [place], [address] Phone: [phone]   Fax:  [phone]  Physical Therapy Treatment  Patient Details  Name: Adam Mcgrath MRN: [id number] Date of Birth: 1971-09-21 Referring Provider (PT): Tarry Kos, MD   Encounter Date: 05/25/2021   PT End of Session - 05/25/21 1410    Visit Number 3    Number of Visits 6    Date for PT Re-Evaluation 06/14/21    Authorization Type Medicaid of [place], request additional visits on 05/25/21    Authorization - Visit Number 3    Authorization - Number of Visits 3    Progress Note Due on Visit 3    PT Start Time 1345    PT Stop Time 1430    PT Time Calculation (min) 45 min    Activity Tolerance Patient tolerated treatment well    Behavior During Therapy [facility] for tasks assessed/performed           History reviewed. No pertinent past medical history.  Past Surgical History:  Procedure Laterality Date  . CLEFT LIP REPAIR    . FEMUR IM NAIL Right 04/10/2021   Procedure: INTRAMEDULLARY (IM) NAIL FEMORAL;  Surgeon: Tarry Kos, MD;  Location: [facility] OR;  Service: Orthopedics;  Laterality: Right;  . MYRINGOTOMY    . NOSE SURGERY      There were no vitals filed for this visit.   Subjective Assessment - 05/25/21 1356    Subjective Pt had MD visit yesterday and reports bone is healing very well and he has been cleared to be WBAT and use of cane.    Patient Stated Goals "Be able to walk normally"    Currently in Pain? Yes    Pain Score 8     Pain Location Hip    Pain Orientation Right;Anterior    Pain Descriptors / Indicators Aching    Pain Type Surgical pain    Pain Radiating Towards right thigh              [facility] PT Assessment - 05/25/21 0001      Assessment   Medical Diagnosis Displaced intertrochanteric fracture of right femur,    Referring Provider (PT) Tarry Kos, MD    Onset Date/Surgical Date 04/10/21                         [facility]  Adult PT Treatment/Exercise - 05/25/21 0001      Ambulation/Gait   Ambulation/Gait Yes    Ambulation/Gait Assistance 6: Modified independent (Device/Increase time)    Ambulation Distance (Feet) 150 Feet    Assistive device Straight cane    Gait Pattern Step-through pattern;Antalgic    Ambulation Surface Level;Indoor    Stairs Yes    Stairs Assistance 5: Supervision    Stairs Assistance Details (indicate cue type and reason) cues for taking step-to pattern due to weakness and decreased weight acceptance RLE    Stair Management Technique One rail Right;With cane    Number of Stairs 4    Height of Stairs 6      Knee/Hip Exercises: Supine   Quad Sets Strengthening;Right;20 reps    Straight Leg Raises Strengthening;Right;1 set;10 reps      Knee/Hip Exercises: Sidelying   Hip ABduction Strengthening;Right;1 set;10 reps      Knee/Hip Exercises: Prone   Hip Extension Strengthening;Right;1 set;10 reps  PT Education - 05/25/21 1409    Education Details review of HEP additions and review of previous session activities. Pt reports non-compliance with HEP for strength and has mainly been walking for activity. Pt reports there is a 4' deep pool where he is staying and therapist recommended above water-based exercises to perform at home for HEP    Person(s) Educated Patient    Methods Explanation;Demonstration    Comprehension Verbalized understanding            PT Short Term Goals - 05/25/21 1426      PT SHORT TERM GOAL #1   Title Patient will be independent with HEP in order to improve functional outcomes.    Time 3    Period Weeks    Status On-going    Target Date 05/24/21      PT SHORT TERM GOAL #2   Title Pt will demo compliance with RLE TTWB to facilitate improved healing    Baseline Pt has been cleared from WBAT as of 05/24/21    Time 1    Period Weeks    Status Deferred    Target Date 05/10/21      PT SHORT TERM GOAL #3   Title Demo improved RLE  strength to 3+/5 all major muscle groups to prepare for gait training    Baseline 3-/5 gross strength RLE at start of care, now demo 3/5 RLE    Time 3    Period Weeks    Status On-going    Target Date 05/24/21             PT Long Term Goals - 05/25/21 1428      PT LONG TERM GOAL #1   Title Patient will demo modified independent ambulation over various surfaces and stairs with normalized pattern    Baseline supervision for cues in RLE TTWB using RW    Time 6    Period Weeks    Status Revised      PT LONG TERM GOAL #2   Title Demo improved ambulation as evidenced by distance of 250 ft during using LRAD    Baseline 180 ft with supervision using RW    Time 6    Period Weeks    Status On-going                 Plan - 05/25/21 1423    Clinical Impression Statement Pt presents to therapy session now cleared for WBAT and use of cane which was provided by ortho MD yesterday and demonstrates fairly good sequence with its use albeit with continued antalgic pattern and hip drop right side in stance phase due to pain/weakness.  Supervision for stair ambulation with cues for step-to pattern due to RLE weakness not allowing safe reciprocal pattern.  Pt reports non-compliance with initial HEP and with his WBing status when TTWB but now cleared by MD.  Continued sessions to progress RLE strength/stability to normalize pattern and reduce risk for falls    Personal Factors and Comorbidities Behavior Pattern;Comorbidity 1;Time since onset of injury/illness/exacerbation    Comorbidities tobacco abuse    Examination-Activity Limitations Bed Mobility;Bend;Carry;Lift;Toileting;Stand;Stairs;Squat;Locomotion Level;Transfers    Examination-Participation Restrictions Cleaning;Community Activity;Driving;Yard Work;Shop    Stability/Clinical Decision Making Stable/Uncomplicated    Rehab Potential Good    PT Frequency 1x / week    PT Duration 6 weeks    PT Treatment/Interventions ADLs/Self Care  Home Management;Aquatic Therapy;Cryotherapy;Electrical Stimulation;DME Instruction;Gait training;Stair training;Functional mobility training;Therapeutic activities;Therapeutic exercise;Balance training;Patient/family education;Neuromuscular re-education;Manual techniques;Passive range of  motion    PT Next Visit Plan assess HEP    PT Home Exercise Plan 3-way SLR, SLR in pool    Consulted and Agree with Plan of Care Patient           Patient will benefit from skilled therapeutic intervention in order to improve the following deficits and impairments:  Abnormal gait,Decreased activity tolerance,Decreased balance,Decreased mobility,Decreased endurance,Decreased range of motion,Decreased strength,Difficulty walking,Improper body mechanics,Pain  Visit Diagnosis: Difficulty in walking, not elsewhere classified  Muscle weakness (generalized)  Other symptoms and signs involving the musculoskeletal system  Right hip pain     Problem List Patient Active Problem List   Diagnosis Date Noted  . Displaced intertrochanteric fracture of right femur, initial encounter for closed fracture (HCC) 04/10/2021  . Closed fracture of subtrochanteric section of femur, right, initial encounter (HCC) 04/10/2021  . Closed subtrochanteric fracture of right femur, initial encounter (HCC) 04/10/2021   2:30 PM, 05/25/21 M. Shary Decamp, PT, DPT Physical Therapist- [facility] Office Number: [phone]  [facility] [address] [place], [place], [address] Phone: [phone]   Fax:  [phone]  Name: Adam Mcgrath MRN: [id number] Date of Birth: 09-22-1971

## 2021-06-01 ENCOUNTER — Encounter (HOSPITAL_COMMUNITY): Payer: Medicaid Other

## 2021-06-01 ENCOUNTER — Telehealth (HOSPITAL_COMMUNITY): Payer: Self-pay

## 2021-06-01 NOTE — Telephone Encounter (Signed)
No show #1, called and left message concerning missed apt today.  Reminded next apt date and time wiht contact number included if needs to cancel/reschedule future apts.  Becky Sax, LPTA/CLT; [place] [phone]

## 2021-06-08 ENCOUNTER — Ambulatory Visit (HOSPITAL_COMMUNITY): Payer: Medicaid Other

## 2021-06-15 ENCOUNTER — Telehealth (HOSPITAL_COMMUNITY): Payer: Self-pay

## 2021-06-15 ENCOUNTER — Ambulatory Visit (HOSPITAL_COMMUNITY): Payer: Medicaid Other

## 2021-06-15 NOTE — Telephone Encounter (Signed)
No show #2.  Called and left message concerning missed apt today.  Pt with no further apts.  Included contact number to call back and schedule further apt or inform us if wishes to be discharged.    Becky Sax, LPTA/CLT; [place] [phone]

## 2021-07-05 ENCOUNTER — Ambulatory Visit (INDEPENDENT_AMBULATORY_CARE_PROVIDER_SITE_OTHER): Payer: Medicaid Other

## 2021-07-05 ENCOUNTER — Encounter: Payer: Self-pay | Admitting: Orthopaedic Surgery

## 2021-07-05 ENCOUNTER — Ambulatory Visit (INDEPENDENT_AMBULATORY_CARE_PROVIDER_SITE_OTHER): Payer: Medicaid Other | Admitting: Orthopaedic Surgery

## 2021-07-05 DIAGNOSIS — S72141A Displaced intertrochanteric fracture of right femur, initial encounter for closed fracture: Secondary | ICD-10-CM

## 2021-07-05 NOTE — Progress Notes (Signed)
   Post-Op Visit Note   Patient: Adam Mcgrath           Date of Birth: 16-Jan-1971           MRN: [id number] Visit Date: 07/05/2021 PCP: Pcp, No   Assessment & Plan:  Chief Complaint:  Chief Complaint  Patient presents with   Right Leg - Fracture, Follow-up   Visit Diagnoses:  1. Displaced intertrochanteric fracture of right femur, initial encounter for closed fracture [facility])     Plan: Mr. Schnyder is approximately 3 months status post IM nail right subtrochanteric fracture.  He is still having some pain at times and sometimes 10 out of 10 per report.  Denies any constant pain.  Continues to smoke cigarettes.  Right hip shows fully healed surgical scars.  Range of motion of the hip is decent without significant pain.  He has a shortened gait with a barely noticeable limp.  His x-rays continue to demonstrate that he is healing the fracture.  I do not think is appropriate to refill the Norco at this time.  I strongly encouraged him to stop smoking.  Given incomplete healing of the fracture and persistent fracture lucency I have ordered a bone stimulator to use.  Recheck in 3 months with two-view x-rays of the right femur.  Follow-Up Instructions: Return in about 3 months (around 10/05/2021).   Orders:  Orders Placed This Encounter  Procedures   XR FEMUR, MIN 2 VIEWS RIGHT   No orders of the defined types were placed in this encounter.   Imaging: XR FEMUR, MIN 2 VIEWS RIGHT  Result Date: 07/05/2021 There has been progression of healing of the subtrochanteric fracture.  No hardware complications.   PMFS History: Patient Active Problem List   Diagnosis Date Noted   Displaced intertrochanteric fracture of right femur, initial encounter for closed fracture (HCC) 04/10/2021   Closed fracture of subtrochanteric section of femur, right, initial encounter (HCC) 04/10/2021   Closed subtrochanteric fracture of right femur, initial encounter (HCC) 04/10/2021   History reviewed. No  pertinent past medical history.  History reviewed. No pertinent family history.  Past Surgical History:  Procedure Laterality Date   CLEFT LIP REPAIR     FEMUR IM NAIL Right 04/10/2021   Procedure: INTRAMEDULLARY (IM) NAIL FEMORAL;  Surgeon: Tarry Kos, MD;  Location: [facility] OR;  Service: Orthopedics;  Laterality: Right;   MYRINGOTOMY     NOSE SURGERY     Social History   Occupational History   Not on file  Tobacco Use   Smoking status: Every Day    Packs/day: 1.00    Types: Cigarettes   Smokeless tobacco: Not on file  Substance and Sexual Activity   Alcohol use: Yes    Comment: occasionally   Drug use: No   Sexual activity: Never

## 2022-06-14 IMAGING — RF DG FEMUR 2+V*R*
1 series · 5 of 5 positions shown · non-contrast
Comparison: Earlier today

CLINICAL DATA: Right IM nail

EXAM:
DG C-ARM 1-60 MIN; RIGHT FEMUR 2 VIEWS
FLUOROSCOPY TIME:  Fluoroscopy Time:  1 minutes 54 seconds
Radiation Exposure Index (if provided by the fluoroscopic device):
11.735
Number of Acquired Spot Images: 0

[Series 1: unknown protocol · 0.14mm/px · 5 of 5 slices shown]
[im 1/5]
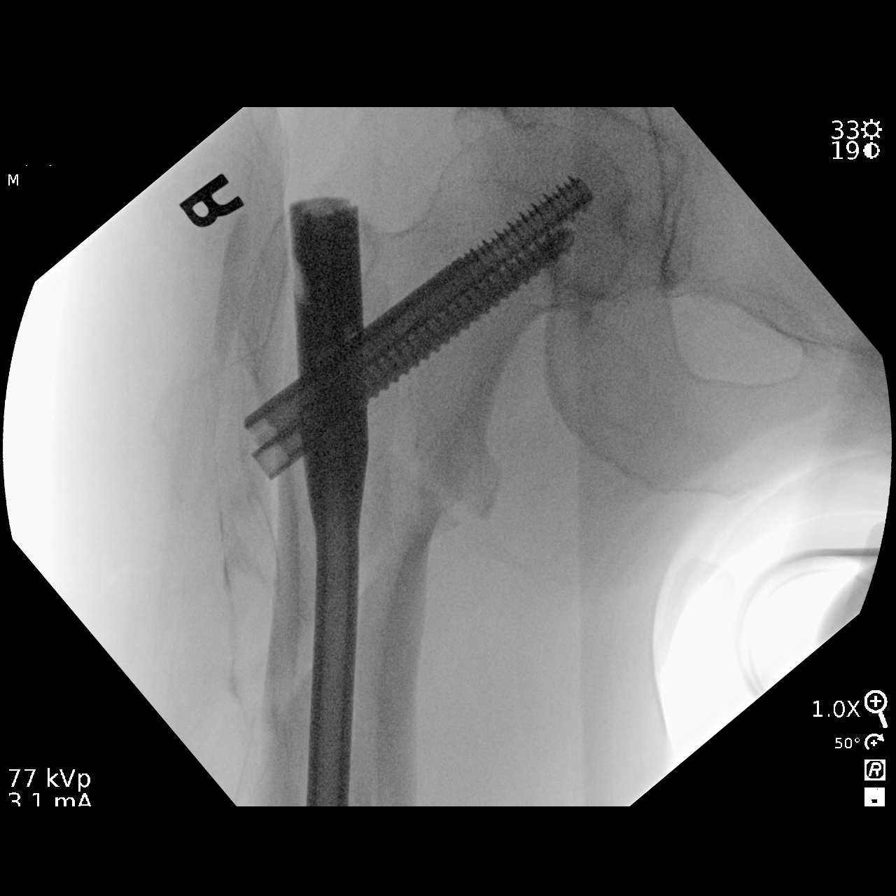
[im 2/5]
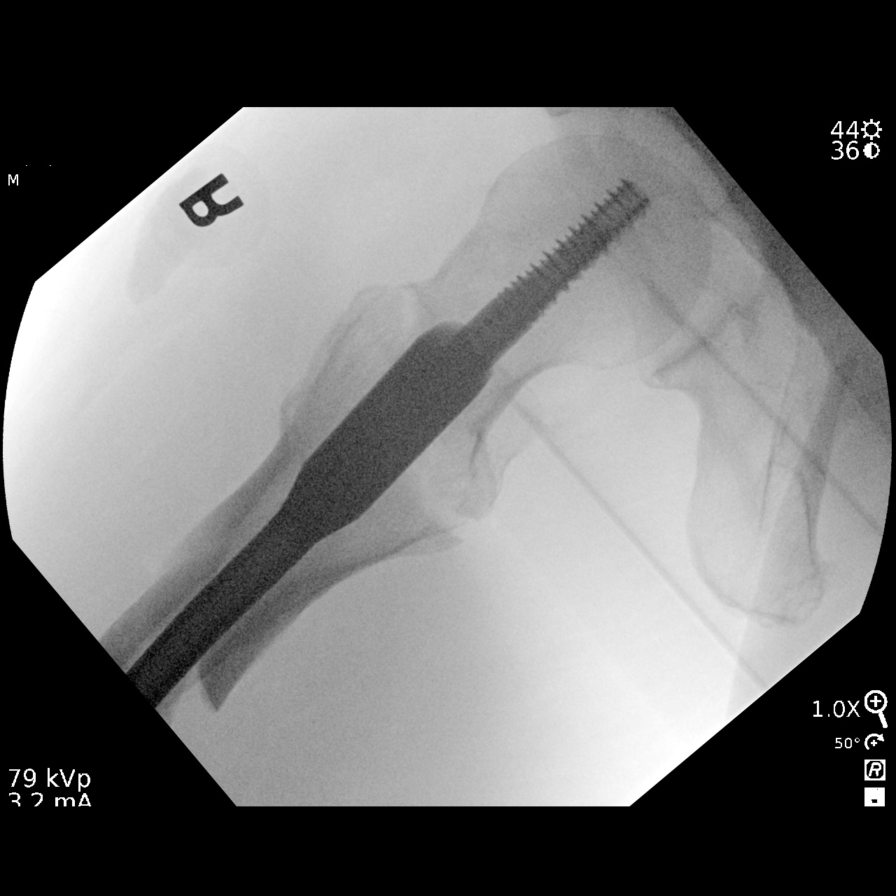
[im 3/5]
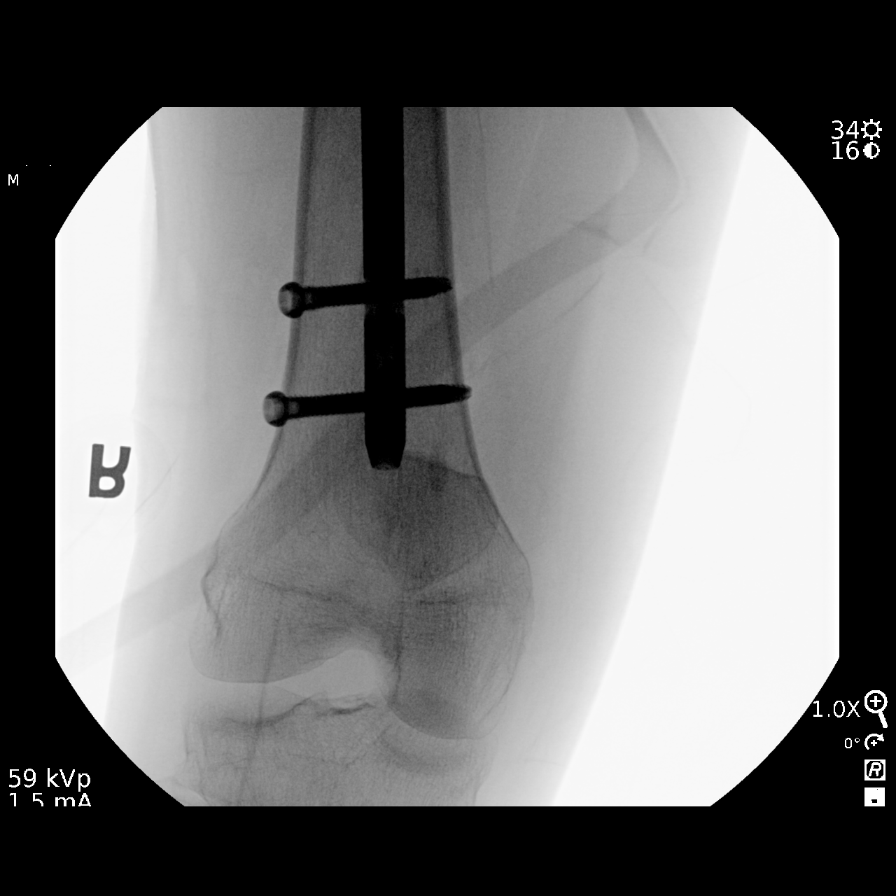
[im 4/5]
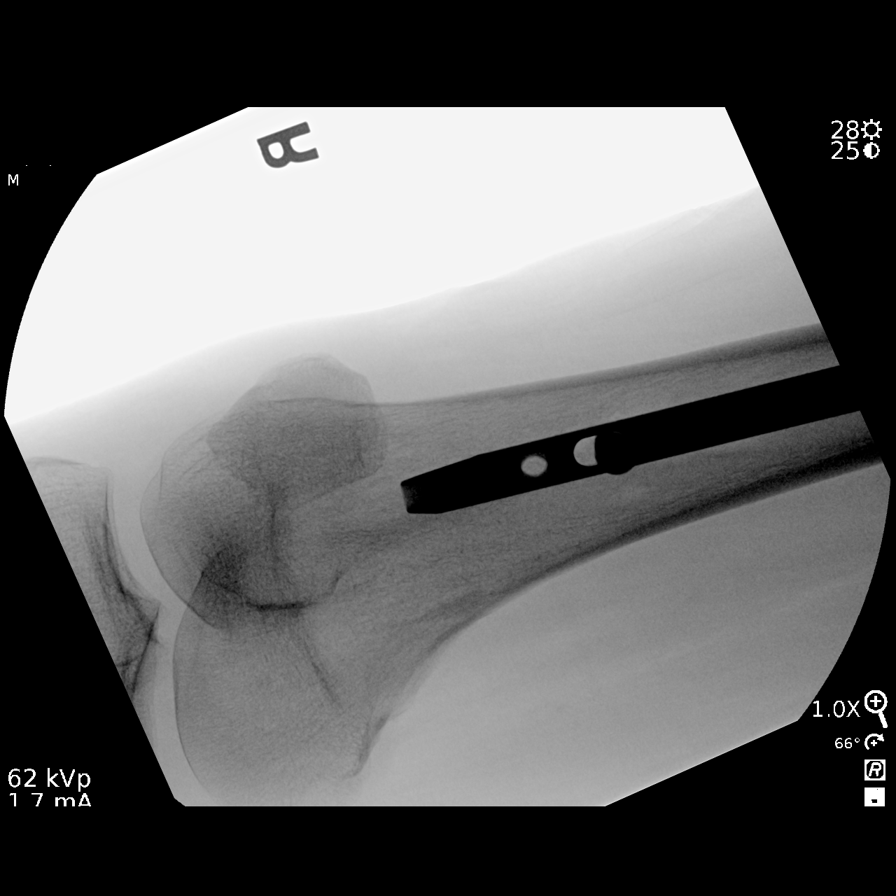
[im 5/5]
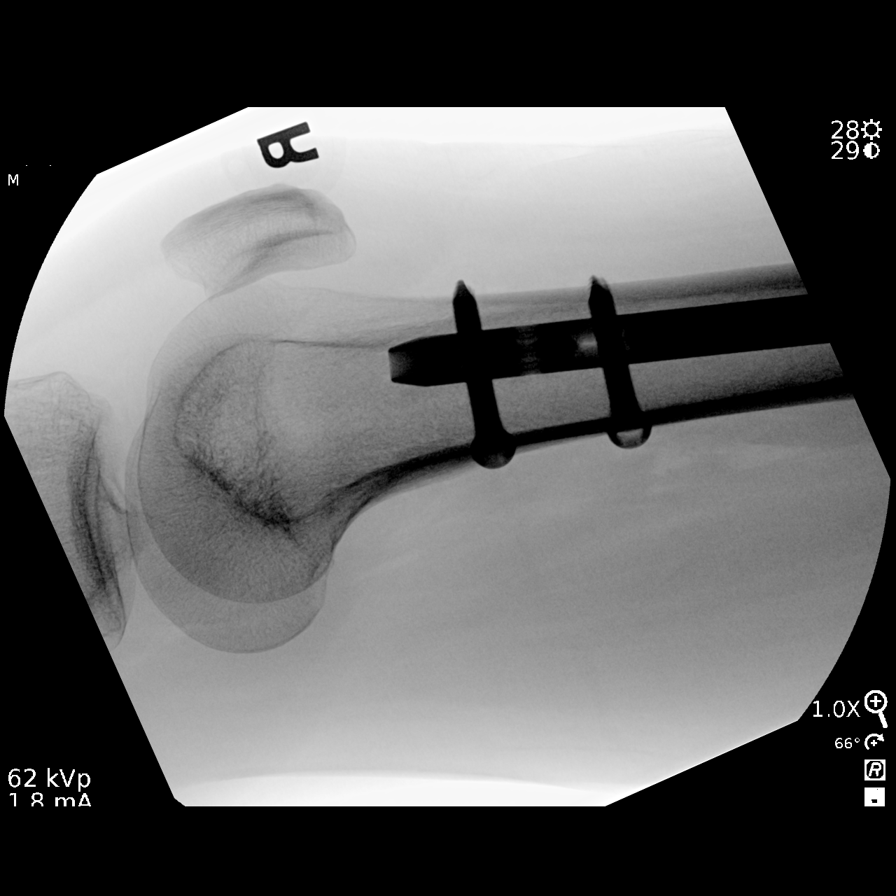

[5 of 5 positions shown; findings below may reference images not displayed]

FINDINGS: Sequential images obtained via portable C-arm radiography in the
operating room show open reduction and internal fixation of the
comminuted proximal femur fracture. IM nail and 2 hip screws are in
place. Fracture fragments are in near anatomic alignment.
IMPRESSION: Status post ORIF of comminuted proximal femur fracture.

## 2023-04-24 ENCOUNTER — Other Ambulatory Visit: Payer: Self-pay

## 2023-04-24 ENCOUNTER — Emergency Department (HOSPITAL_COMMUNITY): Payer: Medicaid Other

## 2023-04-24 ENCOUNTER — Emergency Department (HOSPITAL_COMMUNITY)
Admission: EM | Admit: 2023-04-24 | Discharge: 2023-04-24 | Disposition: A | Discharge status: Home / Self Care | Payer: Medicaid Other | Attending: Student in an Organized Health Care Education/Training Program | Admitting: Student in an Organized Health Care Education/Training Program

## 2023-04-24 ENCOUNTER — Encounter (HOSPITAL_COMMUNITY): Payer: Self-pay | Admitting: Emergency Medicine

## 2023-04-24 DIAGNOSIS — S0003XA Contusion of scalp, initial encounter: Secondary | ICD-10-CM | POA: Diagnosis not present

## 2023-04-24 DIAGNOSIS — S0990XA Unspecified injury of head, initial encounter: Secondary | ICD-10-CM | POA: Diagnosis present

## 2023-04-24 DIAGNOSIS — S40811A Abrasion of right upper arm, initial encounter: Secondary | ICD-10-CM | POA: Insufficient documentation

## 2023-04-24 DIAGNOSIS — Z23 Encounter for immunization: Secondary | ICD-10-CM | POA: Insufficient documentation

## 2023-04-24 DIAGNOSIS — Y92007 Garden or yard of unspecified non-institutional (private) residence as the place of occurrence of the external cause: Secondary | ICD-10-CM | POA: Insufficient documentation

## 2023-04-24 LAB — COMPREHENSIVE METABOLIC PANEL
ALT: 12 U/L (ref 0–44)
AST: 21 U/L (ref 15–41)
Albumin: 3.5 g/dL (ref 3.5–5.0)
Alkaline Phosphatase: 62 U/L (ref 38–126)
Anion gap: 11 (ref 5–15)
BUN: 18 mg/dL (ref 6–20)
CO2: 22 mmol/L (ref 22–32)
Calcium: 9.1 mg/dL (ref 8.9–10.3)
Chloride: 103 mmol/L (ref 98–111)
Creatinine, Ser: 0.97 mg/dL (ref 0.61–1.24)
GFR, Estimated: >60 mL/min (ref >60)
Glucose, Bld: 115 mg/dL — ABNORMAL HIGH (ref 70–99)
Potassium: 4.2 mmol/L (ref 3.5–5.1)
Sodium: 136 mmol/L (ref 135–145)
Total Bilirubin: 0.6 mg/dL (ref 0.3–1.2)
Total Protein: 6.6 g/dL (ref 6.5–8.1)

## 2023-04-24 LAB — I-STAT CHEM 8, ED
BUN: 25 mg/dL — ABNORMAL HIGH (ref 6–20)
Calcium, Ion: 0.92 mmol/L — ABNORMAL LOW (ref 1.15–1.40)
Chloride: 107 mmol/L (ref 98–111)
Creatinine, Ser: 0.9 mg/dL (ref 0.61–1.24)
Glucose, Bld: 122 mg/dL — ABNORMAL HIGH (ref 70–99)
HCT: 36 % — ABNORMAL LOW (ref 39.0–52.0)
Hemoglobin: 12.2 g/dL — ABNORMAL LOW (ref 13.0–17.0)
Potassium: 4.9 mmol/L (ref 3.5–5.1)
Sodium: 135 mmol/L (ref 135–145)
TCO2: 25 mmol/L (ref 22–32)

## 2023-04-24 LAB — SAMPLE TO BLOOD BANK

## 2023-04-24 LAB — PROTIME-INR
INR: 1 (ref 0.8–1.2)
Prothrombin Time: 13.4 seconds (ref 11.4–15.2)

## 2023-04-24 LAB — CBC
HCT: 36.1 % — ABNORMAL LOW (ref 39.0–52.0)
Hemoglobin: 12.5 g/dL — ABNORMAL LOW (ref 13.0–17.0)
MCH: 31.4 pg (ref 26.0–34.0)
MCHC: 34.6 g/dL (ref 30.0–36.0)
MCV: 90.7 fL (ref 80.0–100.0)
Platelets: 397 10*3/uL (ref 150–400)
RBC: 3.98 MIL/uL — ABNORMAL LOW (ref 4.22–5.81)
RDW: 13.6 % (ref 11.5–15.5)
WBC: 9.6 10*3/uL (ref 4.0–10.5)
nRBC: 0 % (ref 0.0–0.2)

## 2023-04-24 LAB — LACTIC ACID, PLASMA: Lactic Acid, Venous: 1.3 mmol/L (ref 0.5–1.9)

## 2023-04-24 LAB — ETHANOL: Alcohol, Ethyl (B): <10 mg/dL (ref <10)

## 2023-04-24 MED ORDER — SODIUM CHLORIDE 0.9 % IV SOLN
INTRAVENOUS | Status: DC | PRN
  Administered 2023-04-24: 1000 mL via INTRAVENOUS

## 2023-04-24 MED ORDER — CEFAZOLIN SODIUM-DEXTROSE 2-4 GM/100ML-% IV SOLN
2.0000 g | Freq: Once | INTRAVENOUS | Status: AC
  Administered 2023-04-24: 2 g via INTRAVENOUS

## 2023-04-24 MED ORDER — NAPROXEN 500 MG PO TABS: 500.0000 mg | ORAL_TABLET | Freq: Two times a day (BID) | ORAL | 0 refills | Status: DC

## 2023-04-24 MED ORDER — IOHEXOL 350 MG/ML SOLN
75.0000 mL | Freq: Once | INTRAVENOUS | Status: AC | PRN
  Administered 2023-04-24: 75 mL via INTRAVENOUS

## 2023-04-24 MED ORDER — CYCLOBENZAPRINE HCL 10 MG PO TABS: 10.0000 mg | ORAL_TABLET | Freq: Two times a day (BID) | ORAL | 0 refills | Status: DC | PRN

## 2023-04-24 MED ORDER — TETANUS-DIPHTH-ACELL PERTUSSIS 5-2.5-18.5 LF-MCG/0.5 IM SUSY
0.5000 mL | PREFILLED_SYRINGE | Freq: Once | INTRAMUSCULAR | Status: AC
  Administered 2023-04-24: 0.5 mL via INTRAMUSCULAR

## 2023-04-24 MED ORDER — MIDAZOLAM HCL 2 MG/2ML IJ SOLN
INTRAMUSCULAR | Status: AC
  Filled 2023-04-24: qty 2

## 2023-04-24 MED ORDER — MIDAZOLAM HCL 2 MG/2ML IJ SOLN
INTRAMUSCULAR | Status: DC | PRN
  Administered 2023-04-24: 2 mg via INTRAVENOUS
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
## 2023-04-24 NOTE — ED Notes (Signed)
Pt unable to stay awake for extended period of time. MD/charge notified. Pt will rest in hallway until more alert and will be discharged.
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
## 2023-04-24 NOTE — ED Notes (Signed)
Pt a/ox4, able to ambulate and tolerate food PO. Pt discharged
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
## 2023-04-25 ENCOUNTER — Encounter: Payer: Self-pay | Admitting: Orthopaedic Surgery

## 2023-04-26 ENCOUNTER — Emergency Department (HOSPITAL_COMMUNITY)
Admission: EM | Admit: 2023-04-26 | Discharge: 2023-04-26 | Disposition: A | Discharge status: Home / Self Care | Payer: Medicaid Other | Attending: Emergency Medicine | Admitting: Emergency Medicine

## 2023-04-26 ENCOUNTER — Observation Stay (HOSPITAL_COMMUNITY): Payer: Medicaid Other

## 2023-04-26 ENCOUNTER — Emergency Department (HOSPITAL_COMMUNITY): Payer: Medicaid Other

## 2023-04-26 ENCOUNTER — Inpatient Hospital Stay (HOSPITAL_COMMUNITY)
Admission: EM | Admit: 2023-04-26 | Discharge: 2023-05-04 | DRG: 064 | Disposition: A | Discharge status: Home / Self Care | Payer: Medicaid Other | Attending: Internal Medicine | Admitting: Internal Medicine

## 2023-04-26 ENCOUNTER — Other Ambulatory Visit: Payer: Self-pay

## 2023-04-26 ENCOUNTER — Encounter (HOSPITAL_COMMUNITY): Payer: Self-pay

## 2023-04-26 ENCOUNTER — Other Ambulatory Visit: Payer: Self-pay | Admitting: Student

## 2023-04-26 DIAGNOSIS — R4182 Altered mental status, unspecified: Secondary | ICD-10-CM | POA: Diagnosis not present

## 2023-04-26 DIAGNOSIS — S80812A Abrasion, left lower leg, initial encounter: Secondary | ICD-10-CM | POA: Diagnosis not present

## 2023-04-26 DIAGNOSIS — F1010 Alcohol abuse, uncomplicated: Secondary | ICD-10-CM | POA: Diagnosis present

## 2023-04-26 DIAGNOSIS — S0081XA Abrasion of other part of head, initial encounter: Secondary | ICD-10-CM

## 2023-04-26 DIAGNOSIS — S069XAA Unspecified intracranial injury with loss of consciousness status unknown, initial encounter: Secondary | ICD-10-CM | POA: Diagnosis present

## 2023-04-26 DIAGNOSIS — S40811A Abrasion of right upper arm, initial encounter: Secondary | ICD-10-CM | POA: Diagnosis not present

## 2023-04-26 DIAGNOSIS — S40812A Abrasion of left upper arm, initial encounter: Secondary | ICD-10-CM | POA: Diagnosis not present

## 2023-04-26 DIAGNOSIS — F17210 Nicotine dependence, cigarettes, uncomplicated: Secondary | ICD-10-CM | POA: Diagnosis present

## 2023-04-26 DIAGNOSIS — I639 Cerebral infarction, unspecified: Secondary | ICD-10-CM | POA: Diagnosis present

## 2023-04-26 DIAGNOSIS — Z87730 Personal history of (corrected) cleft lip and palate: Secondary | ICD-10-CM

## 2023-04-26 DIAGNOSIS — S060X9A Concussion with loss of consciousness of unspecified duration, initial encounter: Secondary | ICD-10-CM

## 2023-04-26 DIAGNOSIS — S80811A Abrasion, right lower leg, initial encounter: Secondary | ICD-10-CM | POA: Diagnosis not present

## 2023-04-26 DIAGNOSIS — Z79899 Other long term (current) drug therapy: Secondary | ICD-10-CM

## 2023-04-26 DIAGNOSIS — I63411 Cerebral infarction due to embolism of right middle cerebral artery: Secondary | ICD-10-CM | POA: Diagnosis present

## 2023-04-26 DIAGNOSIS — F1910 Other psychoactive substance abuse, uncomplicated: Secondary | ICD-10-CM

## 2023-04-26 DIAGNOSIS — S0990XA Unspecified injury of head, initial encounter: Secondary | ICD-10-CM | POA: Diagnosis present

## 2023-04-26 DIAGNOSIS — I6300 Cerebral infarction due to thrombosis of unspecified precerebral artery: Secondary | ICD-10-CM | POA: Diagnosis present

## 2023-04-26 DIAGNOSIS — W19XXXA Unspecified fall, initial encounter: Secondary | ICD-10-CM | POA: Diagnosis present

## 2023-04-26 DIAGNOSIS — T07XXXA Unspecified multiple injuries, initial encounter: Secondary | ICD-10-CM

## 2023-04-26 DIAGNOSIS — E785 Hyperlipidemia, unspecified: Secondary | ICD-10-CM | POA: Diagnosis present

## 2023-04-26 DIAGNOSIS — R296 Repeated falls: Secondary | ICD-10-CM | POA: Diagnosis present

## 2023-04-26 DIAGNOSIS — F1410 Cocaine abuse, uncomplicated: Secondary | ICD-10-CM | POA: Diagnosis present

## 2023-04-26 DIAGNOSIS — F0781 Postconcussional syndrome: Secondary | ICD-10-CM | POA: Diagnosis present

## 2023-04-26 DIAGNOSIS — G9349 Other encephalopathy: Secondary | ICD-10-CM | POA: Diagnosis present

## 2023-04-26 LAB — RAPID URINE DRUG SCREEN, HOSP PERFORMED
Amphetamines: NOT DETECTED
Barbiturates: NOT DETECTED
Benzodiazepines: POSITIVE — AB
Cocaine: POSITIVE — AB
Opiates: NOT DETECTED
Tetrahydrocannabinol: NOT DETECTED

## 2023-04-26 LAB — CBC WITH DIFFERENTIAL/PLATELET
Abs Immature Granulocytes: 0.07 10*3/uL (ref 0.00–0.07)
Abs Immature Granulocytes: 0.05 10*3/uL (ref 0.00–0.07)
Basophils Absolute: 0 10*3/uL (ref 0.0–0.1)
Basophils Absolute: 0 10*3/uL (ref 0.0–0.1)
Basophils Relative: 0 %
Basophils Relative: 0 %
Eosinophils Absolute: 0 10*3/uL (ref 0.0–0.5)
Eosinophils Absolute: 0.1 10*3/uL (ref 0.0–0.5)
Eosinophils Relative: 0 %
Eosinophils Relative: 1 %
HCT: 37.9 % — ABNORMAL LOW (ref 39.0–52.0)
HCT: 36.6 % — ABNORMAL LOW (ref 39.0–52.0)
Hemoglobin: 12.5 g/dL — ABNORMAL LOW (ref 13.0–17.0)
Hemoglobin: 11.8 g/dL — ABNORMAL LOW (ref 13.0–17.0)
Immature Granulocytes: 1 %
Immature Granulocytes: 1 %
Lymphocytes Relative: 19 %
Lymphocytes Relative: 18 %
Lymphs Abs: 2.5 10*3/uL (ref 0.7–4.0)
Lymphs Abs: 1.9 10*3/uL (ref 0.7–4.0)
MCH: 30.8 pg (ref 26.0–34.0)
MCH: 30.7 pg (ref 26.0–34.0)
MCHC: 33 g/dL (ref 30.0–36.0)
MCHC: 32.2 g/dL (ref 30.0–36.0)
MCV: 93.3 fL (ref 80.0–100.0)
MCV: 95.3 fL (ref 80.0–100.0)
Monocytes Absolute: 0.9 10*3/uL (ref 0.1–1.0)
Monocytes Absolute: 0.6 10*3/uL (ref 0.1–1.0)
Monocytes Relative: 7 %
Monocytes Relative: 6 %
Neutro Abs: 9.5 10*3/uL — ABNORMAL HIGH (ref 1.7–7.7)
Neutro Abs: 8.1 10*3/uL — ABNORMAL HIGH (ref 1.7–7.7)
Neutrophils Relative %: 73 %
Neutrophils Relative %: 74 %
Platelets: 397 10*3/uL (ref 150–400)
Platelets: 373 10*3/uL (ref 150–400)
RBC: 4.06 MIL/uL — ABNORMAL LOW (ref 4.22–5.81)
RBC: 3.84 MIL/uL — ABNORMAL LOW (ref 4.22–5.81)
RDW: 13.3 % (ref 11.5–15.5)
RDW: 13.2 % (ref 11.5–15.5)
WBC: 12.9 10*3/uL — ABNORMAL HIGH (ref 4.0–10.5)
WBC: 10.8 10*3/uL — ABNORMAL HIGH (ref 4.0–10.5)
nRBC: 0 % (ref 0.0–0.2)
nRBC: 0 % (ref 0.0–0.2)

## 2023-04-26 LAB — COMPREHENSIVE METABOLIC PANEL
ALT: 13 U/L (ref 0–44)
ALT: 14 U/L (ref 0–44)
AST: 31 U/L (ref 15–41)
AST: 30 U/L (ref 15–41)
Albumin: 3.8 g/dL (ref 3.5–5.0)
Albumin: 3.7 g/dL (ref 3.5–5.0)
Alkaline Phosphatase: 61 U/L (ref 38–126)
Alkaline Phosphatase: 66 U/L (ref 38–126)
Anion gap: 13 (ref 5–15)
Anion gap: 12 (ref 5–15)
BUN: 17 mg/dL (ref 6–20)
BUN: 17 mg/dL (ref 6–20)
CO2: 19 mmol/L — ABNORMAL LOW (ref 22–32)
CO2: 18 mmol/L — ABNORMAL LOW (ref 22–32)
Calcium: 9.4 mg/dL (ref 8.9–10.3)
Calcium: 9.1 mg/dL (ref 8.9–10.3)
Chloride: 102 mmol/L (ref 98–111)
Chloride: 105 mmol/L (ref 98–111)
Creatinine, Ser: 0.87 mg/dL (ref 0.61–1.24)
Creatinine, Ser: 0.84 mg/dL (ref 0.61–1.24)
GFR, Estimated: >60 mL/min (ref >60)
GFR, Estimated: >60 mL/min (ref >60)
Glucose, Bld: 104 mg/dL — ABNORMAL HIGH (ref 70–99)
Glucose, Bld: 97 mg/dL (ref 70–99)
Potassium: 4 mmol/L (ref 3.5–5.1)
Potassium: 4 mmol/L (ref 3.5–5.1)
Sodium: 134 mmol/L — ABNORMAL LOW (ref 135–145)
Sodium: 135 mmol/L (ref 135–145)
Total Bilirubin: 0.9 mg/dL (ref 0.3–1.2)
Total Bilirubin: 0.8 mg/dL (ref 0.3–1.2)
Total Protein: 7.2 g/dL (ref 6.5–8.1)
Total Protein: 7.2 g/dL (ref 6.5–8.1)

## 2023-04-26 LAB — ETHANOL
Alcohol, Ethyl (B): <10 mg/dL (ref <10)
Alcohol, Ethyl (B): <10 mg/dL (ref <10)

## 2023-04-26 LAB — URINALYSIS, ROUTINE W REFLEX MICROSCOPIC
Bilirubin Urine: NEGATIVE
Glucose, UA: NEGATIVE mg/dL
Hgb urine dipstick: NEGATIVE
Ketones, ur: NEGATIVE mg/dL
Leukocytes,Ua: NEGATIVE
Nitrite: NEGATIVE
Protein, ur: NEGATIVE mg/dL
Specific Gravity, Urine: 1.029 (ref 1.005–1.030)
pH: 5 (ref 5.0–8.0)

## 2023-04-26 LAB — PHOSPHORUS: Phosphorus: 2.8 mg/dL (ref 2.5–4.6)

## 2023-04-26 LAB — HIV ANTIBODY (ROUTINE TESTING W REFLEX): HIV Screen 4th Generation wRfx: NONREACTIVE

## 2023-04-26 LAB — VITAMIN B12: Vitamin B-12: 391 pg/mL (ref 180–914)

## 2023-04-26 LAB — TSH: TSH: 2.124 u[IU]/mL (ref 0.350–4.500)

## 2023-04-26 LAB — AMMONIA: Ammonia: 30 umol/L (ref 9–35)

## 2023-04-26 LAB — MAGNESIUM: Magnesium: 1.9 mg/dL (ref 1.7–2.4)

## 2023-04-26 MED ORDER — ENOXAPARIN SODIUM 40 MG/0.4ML IJ SOSY
40.0000 mg | PREFILLED_SYRINGE | INTRAMUSCULAR | Status: DC
  Administered 2023-04-26 – 2023-05-03 (×8): 40 mg via SUBCUTANEOUS
  Filled 2023-04-26 (×8): qty 0.4

## 2023-04-26 MED ORDER — THIAMINE MONONITRATE 100 MG PO TABS
100.0000 mg | ORAL_TABLET | Freq: Every day | ORAL | Status: DC
  Administered 2023-04-28 – 2023-05-04 (×7): 100 mg via ORAL
  Filled 2023-04-26 (×7): qty 1

## 2023-04-26 MED ORDER — THIAMINE HCL 100 MG/ML IJ SOLN
100.0000 mg | Freq: Every day | INTRAMUSCULAR | Status: DC
  Administered 2023-04-26 – 2023-04-27 (×2): 100 mg via INTRAVENOUS
  Filled 2023-04-26 (×2): qty 2

## 2023-04-26 MED ORDER — ADULT MULTIVITAMIN W/MINERALS CH
1.0000 | ORAL_TABLET | Freq: Every day | ORAL | Status: DC
  Administered 2023-04-27 – 2023-05-04 (×8): 1 via ORAL
  Filled 2023-04-26 (×8): qty 1

## 2023-04-26 MED ORDER — SODIUM CHLORIDE 0.9% FLUSH
3.0000 mL | Freq: Two times a day (BID) | INTRAVENOUS | Status: DC
  Administered 2023-04-26 – 2023-05-03 (×14): 3 mL via INTRAVENOUS

## 2023-04-26 MED ORDER — FOLIC ACID 1 MG PO TABS
1.0000 mg | ORAL_TABLET | Freq: Every day | ORAL | Status: DC
  Administered 2023-04-27 – 2023-05-04 (×8): 1 mg via ORAL
  Filled 2023-04-26 (×8): qty 1

## 2023-04-26 MED ORDER — ACETAMINOPHEN 650 MG RE SUPP: 650.0000 mg | Freq: Four times a day (QID) | RECTAL | Status: DC | PRN

## 2023-04-26 MED ORDER — ACETAMINOPHEN 325 MG PO TABS: 650.0000 mg | ORAL_TABLET | Freq: Four times a day (QID) | ORAL | Status: DC | PRN

## 2023-04-26 MED ORDER — BACITRACIN ZINC 500 UNIT/GM EX OINT
TOPICAL_OINTMENT | Freq: Once | CUTANEOUS | Status: AC
  Administered 2023-04-26: 1 via TOPICAL
  Filled 2023-04-26: qty 0.9
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
## 2023-04-26 NOTE — Discharge Instructions (Addendum)
Apply bacitracin to your wounds. This is available over the counter.  Return to the ER for worsening or concerning symptoms.   See resource list for substance use assistance.
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
## 2023-04-26 NOTE — ED Notes (Signed)
C collar placed on pt in triage.

## 2023-04-26 NOTE — ED Notes (Signed)
Patient transported to CT 
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
## 2023-04-26 NOTE — ED Notes (Signed)
Nelva Bush, the patients relative, is okay to be told about patients condition over the phone.

## 2023-04-26 NOTE — ED Provider Notes (Signed)
Blood pressure 94/68, pulse 84, temperature (!) 97 F (36.1 C), temperature source Axillary, resp. rate 18, height 5\' 5"  (1.651 m), weight 72 kg, SpO2 100 %.  Assuming care from Dr. Suezanne Mcgrath.  In short, Adam Mcgrath is a 52 y.o. male with a chief complaint of Fall .  Refer to the original H&P for additional details.  The current Mcgrath of care is to follow with labs and imaging.  Spoke with Dr. Amada Mcgrath. Mcgrath for admit for MRI/EEG and AMS labs.   Discussed patient's case with [facility] to request admission. Patient and family (if present) updated with Mcgrath.   I reviewed all nursing notes, vitals, pertinent old records, EKGs, labs, imaging (as available).    Adam Plan, MD 04/26/23 [phone]

## 2023-04-26 NOTE — ED Notes (Signed)
Patient refused x-ray because he is "Taking a nap"

## 2023-04-26 NOTE — Progress Notes (Signed)
EEG complete - results pending 

## 2023-04-26 NOTE — ED Notes (Signed)
Patient ambulated with no assistance

## 2023-04-27 ENCOUNTER — Observation Stay (HOSPITAL_COMMUNITY): Payer: Medicaid Other

## 2023-04-27 DIAGNOSIS — E785 Hyperlipidemia, unspecified: Secondary | ICD-10-CM | POA: Diagnosis present

## 2023-04-27 DIAGNOSIS — I63321 Cerebral infarction due to thrombosis of right anterior cerebral artery: Secondary | ICD-10-CM | POA: Diagnosis not present

## 2023-04-27 DIAGNOSIS — Z79899 Other long term (current) drug therapy: Secondary | ICD-10-CM | POA: Diagnosis not present

## 2023-04-27 DIAGNOSIS — S060X9D Concussion with loss of consciousness of unspecified duration, subsequent encounter: Secondary | ICD-10-CM | POA: Diagnosis not present

## 2023-04-27 DIAGNOSIS — F1410 Cocaine abuse, uncomplicated: Secondary | ICD-10-CM | POA: Diagnosis present

## 2023-04-27 DIAGNOSIS — R569 Unspecified convulsions: Secondary | ICD-10-CM

## 2023-04-27 DIAGNOSIS — S80812A Abrasion, left lower leg, initial encounter: Secondary | ICD-10-CM | POA: Diagnosis present

## 2023-04-27 DIAGNOSIS — W19XXXA Unspecified fall, initial encounter: Secondary | ICD-10-CM | POA: Diagnosis present

## 2023-04-27 DIAGNOSIS — T07XXXA Unspecified multiple injuries, initial encounter: Secondary | ICD-10-CM | POA: Diagnosis not present

## 2023-04-27 DIAGNOSIS — I639 Cerebral infarction, unspecified: Secondary | ICD-10-CM | POA: Diagnosis not present

## 2023-04-27 DIAGNOSIS — R4182 Altered mental status, unspecified: Secondary | ICD-10-CM | POA: Diagnosis not present

## 2023-04-27 DIAGNOSIS — G9349 Other encephalopathy: Secondary | ICD-10-CM | POA: Diagnosis present

## 2023-04-27 DIAGNOSIS — S0081XA Abrasion of other part of head, initial encounter: Secondary | ICD-10-CM | POA: Diagnosis present

## 2023-04-27 DIAGNOSIS — S40812A Abrasion of left upper arm, initial encounter: Secondary | ICD-10-CM | POA: Diagnosis present

## 2023-04-27 DIAGNOSIS — F17200 Nicotine dependence, unspecified, uncomplicated: Secondary | ICD-10-CM | POA: Diagnosis not present

## 2023-04-27 DIAGNOSIS — S069XAD Unspecified intracranial injury with loss of consciousness status unknown, subsequent encounter: Secondary | ICD-10-CM | POA: Diagnosis not present

## 2023-04-27 DIAGNOSIS — I6300 Cerebral infarction due to thrombosis of unspecified precerebral artery: Secondary | ICD-10-CM | POA: Diagnosis present

## 2023-04-27 DIAGNOSIS — R296 Repeated falls: Secondary | ICD-10-CM | POA: Diagnosis present

## 2023-04-27 DIAGNOSIS — S80811A Abrasion, right lower leg, initial encounter: Secondary | ICD-10-CM | POA: Diagnosis present

## 2023-04-27 DIAGNOSIS — S40811A Abrasion of right upper arm, initial encounter: Secondary | ICD-10-CM | POA: Diagnosis present

## 2023-04-27 DIAGNOSIS — F1010 Alcohol abuse, uncomplicated: Secondary | ICD-10-CM | POA: Diagnosis present

## 2023-04-27 DIAGNOSIS — I63411 Cerebral infarction due to embolism of right middle cerebral artery: Secondary | ICD-10-CM | POA: Diagnosis present

## 2023-04-27 DIAGNOSIS — S060X9A Concussion with loss of consciousness of unspecified duration, initial encounter: Secondary | ICD-10-CM

## 2023-04-27 DIAGNOSIS — I6389 Other cerebral infarction: Secondary | ICD-10-CM

## 2023-04-27 DIAGNOSIS — F0781 Postconcussional syndrome: Secondary | ICD-10-CM | POA: Diagnosis present

## 2023-04-27 DIAGNOSIS — F17210 Nicotine dependence, cigarettes, uncomplicated: Secondary | ICD-10-CM | POA: Diagnosis present

## 2023-04-27 DIAGNOSIS — Z87730 Personal history of (corrected) cleft lip and palate: Secondary | ICD-10-CM | POA: Diagnosis not present

## 2023-04-27 DIAGNOSIS — S069XAA Unspecified intracranial injury with loss of consciousness status unknown, initial encounter: Secondary | ICD-10-CM | POA: Diagnosis present

## 2023-04-27 LAB — COMPREHENSIVE METABOLIC PANEL
ALT: 14 U/L (ref 0–44)
AST: 24 U/L (ref 15–41)
Albumin: 3.3 g/dL — ABNORMAL LOW (ref 3.5–5.0)
Alkaline Phosphatase: 67 U/L (ref 38–126)
Anion gap: 12 (ref 5–15)
BUN: 15 mg/dL (ref 6–20)
CO2: 22 mmol/L (ref 22–32)
Calcium: 9.2 mg/dL (ref 8.9–10.3)
Chloride: 103 mmol/L (ref 98–111)
Creatinine, Ser: 0.88 mg/dL (ref 0.61–1.24)
GFR, Estimated: >60 mL/min (ref >60)
Glucose, Bld: 68 mg/dL — ABNORMAL LOW (ref 70–99)
Potassium: 4.2 mmol/L (ref 3.5–5.1)
Sodium: 137 mmol/L (ref 135–145)
Total Bilirubin: 0.8 mg/dL (ref 0.3–1.2)
Total Protein: 6.8 g/dL (ref 6.5–8.1)

## 2023-04-27 LAB — CBC
HCT: 35.1 % — ABNORMAL LOW (ref 39.0–52.0)
Hemoglobin: 11.8 g/dL — ABNORMAL LOW (ref 13.0–17.0)
MCH: 30.7 pg (ref 26.0–34.0)
MCHC: 33.6 g/dL (ref 30.0–36.0)
MCV: 91.4 fL (ref 80.0–100.0)
Platelets: 356 10*3/uL (ref 150–400)
RBC: 3.84 MIL/uL — ABNORMAL LOW (ref 4.22–5.81)
RDW: 13.3 % (ref 11.5–15.5)
WBC: 11.8 10*3/uL — ABNORMAL HIGH (ref 4.0–10.5)
nRBC: 0 % (ref 0.0–0.2)

## 2023-04-27 LAB — LIPID PANEL
Cholesterol: 150 mg/dL (ref 0–200)
HDL: 53 mg/dL (ref >40)
LDL Cholesterol: 80 mg/dL (ref 0–99)
Total CHOL/HDL Ratio: 2.8 RATIO
Triglycerides: 87 mg/dL (ref <150)
VLDL: 17 mg/dL (ref 0–40)

## 2023-04-27 LAB — ECHOCARDIOGRAM COMPLETE BUBBLE STUDY
Area-P 1/2: 3.6 cm2
S' Lateral: 2.2 cm

## 2023-04-27 LAB — HEMOGLOBIN A1C
Hgb A1c MFr Bld: 5.4 % (ref 4.8–5.6)
Mean Plasma Glucose: 108.28 mg/dL

## 2023-04-27 LAB — GLUCOSE, CAPILLARY: Glucose-Capillary: 151 mg/dL — ABNORMAL HIGH (ref 70–99)

## 2023-04-27 MED ORDER — CYANOCOBALAMIN 1000 MCG/ML IJ SOLN
1000.0000 ug | Freq: Once | INTRAMUSCULAR | Status: AC
  Administered 2023-04-27: 1000 ug via SUBCUTANEOUS
  Filled 2023-04-27: qty 1

## 2023-04-27 MED ORDER — IOHEXOL 350 MG/ML SOLN
75.0000 mL | Freq: Once | INTRAVENOUS | Status: AC | PRN
  Administered 2023-04-27: 75 mL via INTRAVENOUS

## 2023-04-27 MED ORDER — ACETAMINOPHEN 325 MG PO TABS
650.0000 mg | ORAL_TABLET | Freq: Four times a day (QID) | ORAL | Status: DC | PRN
  Administered 2023-04-29 – 2023-04-30 (×2): 650 mg via ORAL
  Filled 2023-04-27 (×2): qty 2

## 2023-04-27 MED ORDER — DEXTROSE-NACL 5-0.45 % IV SOLN: INTRAVENOUS | Status: DC

## 2023-04-27 MED ORDER — TRAMADOL HCL 50 MG PO TABS
50.0000 mg | ORAL_TABLET | Freq: Four times a day (QID) | ORAL | Status: DC | PRN
  Administered 2023-04-27 – 2023-04-30 (×5): 50 mg via ORAL
  Filled 2023-04-27 (×5): qty 1

## 2023-04-27 MED ORDER — CLOPIDOGREL BISULFATE 75 MG PO TABS
75.0000 mg | ORAL_TABLET | Freq: Every day | ORAL | Status: DC
  Administered 2023-04-27 – 2023-05-04 (×8): 75 mg via ORAL
  Filled 2023-04-27 (×8): qty 1

## 2023-04-27 MED ORDER — STROKE: EARLY STAGES OF RECOVERY BOOK
Freq: Once | Status: AC
  Filled 2023-04-27: qty 1

## 2023-04-27 MED ORDER — ASPIRIN 81 MG PO TBEC
81.0000 mg | DELAYED_RELEASE_TABLET | Freq: Every day | ORAL | Status: DC
  Administered 2023-04-27 – 2023-05-04 (×8): 81 mg via ORAL
  Filled 2023-04-27 (×8): qty 1

## 2023-04-27 MED ORDER — NICOTINE 21 MG/24HR TD PT24
21.0000 mg | MEDICATED_PATCH | Freq: Every day | TRANSDERMAL | Status: DC
  Administered 2023-04-27 – 2023-05-04 (×8): 21 mg via TRANSDERMAL
  Filled 2023-04-27 (×8): qty 1

## 2023-04-27 MED ORDER — VITAMIN B-12 1000 MCG PO TABS
1000.0000 ug | ORAL_TABLET | Freq: Every day | ORAL | Status: DC
  Administered 2023-04-28 – 2023-05-04 (×7): 1000 ug via ORAL
  Filled 2023-04-27 (×7): qty 1

## 2023-04-27 MED ORDER — ROSUVASTATIN CALCIUM 5 MG PO TABS
10.0000 mg | ORAL_TABLET | Freq: Every day | ORAL | Status: DC
  Administered 2023-04-27 – 2023-05-04 (×8): 10 mg via ORAL
  Filled 2023-04-27 (×8): qty 2
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
## 2023-04-27 NOTE — Progress Notes (Signed)
Inpatient Rehab Admissions Coordinator Note:   Per therapy recommendations patient was screened for CIR candidacy by Stephania Fragmin, PT. At this time, pt appears to be a potential candidate for CIR. I will place an order for rehab consult for full assessment, per our protocol.  Please contact me any with questions.Estill Dooms, PT, DPT [phone] 04/27/23 3:38 PM

## 2023-04-27 NOTE — Progress Notes (Signed)
*  PRELIMINARY RESULTS* Echocardiogram 2D Echocardiogram has been performed.  Adam Mcgrath Banker 04/27/2023, 8:39 AM

## 2023-04-27 NOTE — TOC Initial Note (Signed)
Transition of Care [facility]) - Initial/Assessment Note    Patient Details  Name: Adam Mcgrath MRN: [id number] Date of Birth: [birth year]  Transition of Care [facility]) CM/SW Contact:    Kermit Balo, RN Phone Number: 04/27/2023, 3:10 PM  Clinical Narrative:                 CM met with the patient at the bedside. Pt too lethargic to participate.  TOC following.  Expected Discharge Plan: IP Rehab Facility Barriers to Discharge: Continued Medical Work up   Patient Goals and CMS Choice   CMS Medicare.gov Compare Post Acute Care list provided to:: Patient Choice offered to / list presented to : Patient      Expected Discharge Plan and Services     Post Acute Care Choice: IP Rehab Living arrangements for the past 2 months: Single Family Home                                      Prior Living Arrangements/Services Living arrangements for the past 2 months: Single Family Home Lives with:: Self                   Activities of Daily Living   ADL Screening (condition at time of admission) Patient's cognitive ability adequate to safely complete daily activities?: No  Permission Sought/Granted                  Emotional Assessment              Admission diagnosis:  TBI (traumatic brain injury) (HCC) [S06.9XAA] Multiple abrasions [T07.XXXA] Altered mental status, unspecified altered mental status type [R41.82] Patient Active Problem List   Diagnosis Date Noted   TBI (traumatic brain injury) (HCC) 04/26/2023   Displaced intertrochanteric fracture of right femur, initial encounter for closed fracture (HCC) 04/10/2021   Closed fracture of subtrochanteric section of femur, right, initial encounter (HCC) 04/10/2021   Closed subtrochanteric fracture of right femur, initial encounter (HCC) 04/10/2021   PCP:  Pcp, No Pharmacy:   [id number] G[place] Otto, [place] - [address] [facility] [address] [place]  [address] Phone: [phone] Fax: [phone]     Social Determinants of Health (SDOH) Social History: SDOH Screenings   Tobacco Use: High Risk (07/05/2021)   SDOH Interventions:     Readmission Risk Interventions     No data to display
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
## 2023-04-27 NOTE — TOC CAGE-AID Note (Signed)
Transition of Care [facility]) - CAGE-AID Screening   Patient Details  Name: Adam Mcgrath MRN: [id number] Date of Birth: 1971/02/05  Transition [facility]) CM/SW Contact:    Kermit Balo, RN Phone Number: 04/27/2023, 3:09 PM   Clinical Narrative: CM left counseling resources for drug abuse at the bedside.   CAGE-AID Screening: Substance Abuse Screening unable to be completed due to: : Patient unable to participate
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
## 2023-04-27 NOTE — Progress Notes (Signed)
At 2300, Dr. Julian Reil updated about MRI result and that relative Nelva Bush wants to speak to physician regarding patient and MRI result.

## 2023-04-28 ENCOUNTER — Inpatient Hospital Stay (HOSPITAL_COMMUNITY): Payer: Medicaid Other

## 2023-04-28 DIAGNOSIS — F17200 Nicotine dependence, unspecified, uncomplicated: Secondary | ICD-10-CM | POA: Diagnosis not present

## 2023-04-28 DIAGNOSIS — S060X9D Concussion with loss of consciousness of unspecified duration, subsequent encounter: Secondary | ICD-10-CM | POA: Diagnosis not present

## 2023-04-28 DIAGNOSIS — I639 Cerebral infarction, unspecified: Secondary | ICD-10-CM

## 2023-04-28 DIAGNOSIS — F1010 Alcohol abuse, uncomplicated: Secondary | ICD-10-CM | POA: Diagnosis not present

## 2023-04-28 DIAGNOSIS — I6300 Cerebral infarction due to thrombosis of unspecified precerebral artery: Secondary | ICD-10-CM | POA: Diagnosis not present

## 2023-04-28 DIAGNOSIS — I63321 Cerebral infarction due to thrombosis of right anterior cerebral artery: Secondary | ICD-10-CM | POA: Diagnosis not present

## 2023-04-28 DIAGNOSIS — F1410 Cocaine abuse, uncomplicated: Secondary | ICD-10-CM | POA: Diagnosis not present

## 2023-04-28 LAB — CBC
HCT: 30.6 % — ABNORMAL LOW (ref 39.0–52.0)
Hemoglobin: 10.1 g/dL — ABNORMAL LOW (ref 13.0–17.0)
MCH: 30.6 pg (ref 26.0–34.0)
MCHC: 33 g/dL (ref 30.0–36.0)
MCV: 92.7 fL (ref 80.0–100.0)
Platelets: 393 10*3/uL (ref 150–400)
RBC: 3.3 MIL/uL — ABNORMAL LOW (ref 4.22–5.81)
RDW: 13.3 % (ref 11.5–15.5)
WBC: 10.1 10*3/uL (ref 4.0–10.5)
nRBC: 0 % (ref 0.0–0.2)

## 2023-04-28 LAB — COMPREHENSIVE METABOLIC PANEL
ALT: 12 U/L (ref 0–44)
AST: 19 U/L (ref 15–41)
Albumin: 2.9 g/dL — ABNORMAL LOW (ref 3.5–5.0)
Alkaline Phosphatase: 55 U/L (ref 38–126)
Anion gap: 7 (ref 5–15)
BUN: 15 mg/dL (ref 6–20)
CO2: 24 mmol/L (ref 22–32)
Calcium: 8.4 mg/dL — ABNORMAL LOW (ref 8.9–10.3)
Chloride: 103 mmol/L (ref 98–111)
Creatinine, Ser: 0.73 mg/dL (ref 0.61–1.24)
GFR, Estimated: >60 mL/min (ref >60)
Glucose, Bld: 88 mg/dL (ref 70–99)
Potassium: 3.5 mmol/L (ref 3.5–5.1)
Sodium: 134 mmol/L — ABNORMAL LOW (ref 135–145)
Total Bilirubin: 0.4 mg/dL (ref 0.3–1.2)
Total Protein: 6.1 g/dL — ABNORMAL LOW (ref 6.5–8.1)

## 2023-04-28 LAB — PHOSPHORUS: Phosphorus: 3.2 mg/dL (ref 2.5–4.6)

## 2023-04-28 LAB — MAGNESIUM: Magnesium: 1.6 mg/dL — ABNORMAL LOW (ref 1.7–2.4)

## 2023-04-28 MED ORDER — MAGNESIUM GLUCONATE 500 MG PO TABS
500.0000 mg | ORAL_TABLET | Freq: Two times a day (BID) | ORAL | Status: DC
  Administered 2023-04-28 – 2023-05-04 (×13): 500 mg via ORAL
  Filled 2023-04-28 (×14): qty 1

## 2023-04-28 NOTE — Progress Notes (Signed)
VASCULAR LAB    TCD bubble study has been performed.  See CV proc for preliminary results.   Nature Vogelsang, RVT 04/28/2023, 3:03 PM

## 2023-04-28 NOTE — Progress Notes (Signed)
Inpatient Rehab Admissions Coordinator:   I met with Pt. To discuss potential CIR admit. Pt. Understands he needs to do some rehab and states no one can stay with him 24/7. I spoke with his cousin Nelva Bush and she confirmed that she is not able to provide 24/7 assist and that he has no other family. Due to  cognitive impairments, I do not think Pt. Can safely return home alone. Pt.'s cousin works at [employer], and would like him placed there if possible.   Megan Salon, MS, CCC-SLP Rehab Admissions Coordinator  [phone] (celll) [phone] (office)

## 2023-04-28 NOTE — Progress Notes (Addendum)
STROKE TEAM PROGRESS NOTE   INTERVAL HISTORY Patient is sitting in the chair in no apparent distress.  No family at the bedside.  He is calm and cooperating with exam today.  No new neurological events overnight   Vitals:   04/27/23 2012 04/27/23 2337 04/28/23 0326 04/28/23 0755  BP: 92/69 93/66 94/68  90/73  Pulse: 77 78 79 85  Resp: 18   18  Temp: 98.1 F (36.7 C) 98 F (36.7 C) 97.9 F (36.6 C) (!) 97.5 F (36.4 C)  TempSrc: Oral Oral Oral Oral  SpO2: 98% 99% 99% 98%  Weight:      Height:       CBC:  Recent Labs  Lab 04/26/23 0331 04/26/23 1151 04/27/23 0511 04/28/23 0334  WBC 12.9* 10.8* 11.8* 10.1  NEUTROABS 9.5* 8.1*  --   --   HGB 12.5* 11.8* 11.8* 10.1*  HCT 37.9* 36.6* 35.1* 30.6*  MCV 93.3 95.3 91.4 92.7  PLT 397 373 356 393    Basic Metabolic Panel:  Recent Labs  Lab 04/26/23 1151 04/27/23 0511 04/28/23 0334  NA 135 137 134*  K 4.0 4.2 3.5  CL 105 103 103  CO2 18* 22 24  GLUCOSE 97 68* 88  BUN 17 15 15   CREATININE 0.84 0.88 0.73  CALCIUM 9.1 9.2 8.4*  MG 1.9  --  1.6*  PHOS 2.8  --  3.2    Lipid Panel:  Recent Labs  Lab 04/27/23 0511  CHOL 150  TRIG 87  HDL 53  CHOLHDL 2.8  VLDL 17  LDLCALC 80    HgbA1c:  Recent Labs  Lab 04/27/23 0511  HGBA1C 5.4    Urine Drug Screen:  Recent Labs  Lab 04/26/23 0419  LABOPIA NONE DETECTED  COCAINSCRNUR POSITIVE*  LABBENZ POSITIVE*  AMPHETMU NONE DETECTED  THCU NONE DETECTED  LABBARB NONE DETECTED     Alcohol Level  Recent Labs  Lab 04/26/23 1548  ETH <10     IMAGING past 24 hours No results found.  PHYSICAL EXAM General: middle aged Caucasian male, sitting up in chair, NAD. Head: multiple abrasions of the face, forehead, and posterior left ear. R periorbital ecchymosis noted. CV: normal rate and regular rhythm, no m/r/g. Pulm: normal respiratory effort on RA. Extremities: abrasions to bilateral knees and bilateral upper extremities.  Neuro: Mental Status: Awake and  alert oriented to self and age.  Knows the city is [name] and that he is in a hospital otherwise unable to answer any other orientation questions  Cranial nerves: II: PERRL III, IV, VI: EOMI, no gaze preference, pupils do pass midline. V: facial sensation symmetric VII: no facial drop noted VIII: hearing intact to sound XII: tongue protrudes midline  Motor: strength 5/5 in RLE and RUE. Strength 5/5 in LUE and 4+/5 in LLE. Subtle LLE pronator drift present.  Sensation: sensation intact bilaterally throughout.  Coordination: unable to assess. Gait: deferred for patient's safety.   ASSESSMENT/Mcgrath Adam Mcgrath is a 52 y.o. male with history of cocaine use disorder, alcohol use disorder,  presenting with falls and altered mental status. MRI imaging showing multiple infarcts in R ACA and R MCA distribution, concerning for possible cardioembolic etiology.    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 ASPECTS 10.  CTA head & neck no emergent LVO or high-grade stenosis of intracranial arteries MRI  multiple punctate foci of acute ischemia in R frontal lobe 2D  Echo severely limited study due to uncooperative patient - LVEF 60-65% TCD bubble study negative for PFO LDL 80 HgbA1c 5.4 VTE prophylaxis - lovenox No antithrombotic prior to admission, now on aspirin 81 mg daily and clopidogrel 75 mg daily DAPT x 3 weeks, then ASA alone lifelong Therapy recommendations: CIR Disposition:  pending  BP management Home meds:  none Stable Long-term BP goal normotensive  Hyperlipidemia Home meds:  none  LDL 80, goal < 70 Add crestor 10mg  daily  Continue statin at discharge  Cocaine abuse Per cousin, cocaine daily user Needs extensive counseling on cessation of cocaine use Pt is willing to quit today  Tobacco abuse Current heavy smoker Smoking cessation counseling provided Nicotine patch  provided Pt is willing to quit  Other Stroke Risk Factors Heavy ETOH use, advised to drink no more than 1-2 drink(s) a day, on FA/MVI/B1  Hospital day # 1  Adam Mart DNP, ACNPC-AG  [facility]  ATTENDING NOTE: I reviewed above note and agree with the assessment and Mcgrath. Pt was seen and examined.   Cousin at the bedside. She stated that pt smokes cigarettes and uses cocaine everyday and not sure if he will quit. I have given him cessation education and he is willing to quit.   On exam, pt has several facial laceration with dried scars. He is awake, alert, eyes open, orientated to age, place and people, but not to time. No aphasia, fluent language although slow on speech, following all simple commands, but moderate dysarthria. Able to name and repeat in dysarthric voice. No gaze palsy, tracking bilaterally, visual field full. Poor denture, slight right facial droop. Tongue midline. 4/5 in all extremities. Sensation symmetrical bilaterally, b/l FTN intact but slow, gait not tested.   Patient stroke consistent with large vessel disease due to chronic cocaine abuse, heavy smoking and heavy alcohol.  Risk factor education provided.  Continue DAPT for 3 weeks and then aspirin alone.  Continue statin.  PT and OT recommend CIR.  For detailed assessment and Mcgrath, please refer to above/below as I have made changes wherever appropriate.   Neurology will sign off. Please call with questions. Pt will follow up with stroke clinic NP at [facility] in about 4 weeks. Thanks for the consult.   Adam Plan, MD PhD Stroke Neurology 04/28/2023 4:03 PM     To contact Stroke Continuity provider, please refer to [email or web address]. After hours, contact General Neurology

## 2023-04-28 NOTE — Progress Notes (Signed)
Per CIR, pt not appropriate for inpatient rehab due to lack of support system. Pt's cousin Nelva Bush works at [employer] and is requesting SNF placement there. Unsure if [facility] will accept managed medicaid but will start SNF search and f/u with offers as available.   Dellie Burns, MSW, LCSW [phone] (coverage)
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
## 2023-04-28 NOTE — Plan of Care (Signed)
  Problem: Self-Care: Goal: Ability to communicate needs accurately will improve Outcome: Not Progressing   Problem: Ischemic Stroke/TIA Tissue Perfusion: Goal: Complications of ischemic stroke/TIA will be minimized Outcome: Not Progressing   Problem: Safety: Goal: Ability to remain free from injury will improve Outcome: Not Progressing

## 2023-04-28 NOTE — Progress Notes (Signed)
Patient unhooked himself from cardiac monitoring, this nurse went to fix leads but patient strongly refused to be hooked on it again stating " It annoys me, I don't want it back". Explained the need for monitoring to no avail. Informed Dr. Julian Reil, note refusal.

## 2023-04-28 NOTE — Progress Notes (Signed)
At 1930, patient seen bleeding on right arm after he has removed dressing for abrasion, patient instructed not to remove dressing but he stated " that is my blood so it is my right". Patient instructed that if he starts ripping things again that we will have to put mittens back on.
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
## 2023-04-29 DIAGNOSIS — I6300 Cerebral infarction due to thrombosis of unspecified precerebral artery: Secondary | ICD-10-CM | POA: Diagnosis not present

## 2023-04-29 DIAGNOSIS — S060X9D Concussion with loss of consciousness of unspecified duration, subsequent encounter: Secondary | ICD-10-CM | POA: Diagnosis not present

## 2023-04-29 LAB — BASIC METABOLIC PANEL
Anion gap: 9 (ref 5–15)
BUN: 14 mg/dL (ref 6–20)
CO2: 23 mmol/L (ref 22–32)
Calcium: 8.5 mg/dL — ABNORMAL LOW (ref 8.9–10.3)
Chloride: 100 mmol/L (ref 98–111)
Creatinine, Ser: 0.8 mg/dL (ref 0.61–1.24)
GFR, Estimated: >60 mL/min (ref >60)
Glucose, Bld: 104 mg/dL — ABNORMAL HIGH (ref 70–99)
Potassium: 3.9 mmol/L (ref 3.5–5.1)
Sodium: 132 mmol/L — ABNORMAL LOW (ref 135–145)

## 2023-04-29 LAB — MAGNESIUM: Magnesium: 1.7 mg/dL (ref 1.7–2.4)

## 2023-04-29 NOTE — Plan of Care (Signed)
  Problem: Clinical Measurements: Goal: Ability to maintain clinical measurements within normal limits will improve Outcome: Not Progressing   Problem: Activity: Goal: Risk for activity intolerance will decrease Outcome: Not Progressing   Problem: Pain Managment: Goal: General experience of comfort will improve Outcome: Not Progressing   Problem: Safety: Goal: Ability to remain free from injury will improve Outcome: Not Progressing
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
## 2023-04-29 NOTE — Plan of Care (Signed)
  Problem: Education: Goal: Knowledge of General Education information will improve Description Including pain rating scale, medication(s)/side effects and non-pharmacologic comfort measures Outcome: Progressing   Problem: Health Behavior/Discharge Planning: Goal: Ability to manage health-related needs will improve Outcome: Progressing   

## 2023-04-30 ENCOUNTER — Encounter (HOSPITAL_COMMUNITY): Payer: Self-pay | Admitting: Internal Medicine

## 2023-04-30 ENCOUNTER — Other Ambulatory Visit: Payer: Self-pay

## 2023-04-30 DIAGNOSIS — S069XAD Unspecified intracranial injury with loss of consciousness status unknown, subsequent encounter: Secondary | ICD-10-CM | POA: Diagnosis not present

## 2023-04-30 LAB — BASIC METABOLIC PANEL WITH GFR
Anion gap: 6 (ref 5–15)
BUN: 20 mg/dL (ref 6–20)
CO2: 25 mmol/L (ref 22–32)
Calcium: 8.9 mg/dL (ref 8.9–10.3)
Chloride: 102 mmol/L (ref 98–111)
Creatinine, Ser: 1.03 mg/dL (ref 0.61–1.24)
GFR, Estimated: >60 mL/min
Glucose, Bld: 89 mg/dL (ref 70–99)
Potassium: 4.6 mmol/L (ref 3.5–5.1)
Sodium: 133 mmol/L — ABNORMAL LOW (ref 135–145)

## 2023-04-30 LAB — MAGNESIUM: Magnesium: 2 mg/dL (ref 1.7–2.4)

## 2023-04-30 MED ORDER — BISACODYL 10 MG RE SUPP: 10.0000 mg | Freq: Every day | RECTAL | Status: DC | PRN

## 2023-04-30 MED ORDER — SENNOSIDES-DOCUSATE SODIUM 8.6-50 MG PO TABS
1.0000 | ORAL_TABLET | Freq: Two times a day (BID) | ORAL | Status: DC
  Administered 2023-04-30 – 2023-05-04 (×9): 1 via ORAL
  Filled 2023-04-30 (×9): qty 1

## 2023-04-30 MED ORDER — POLYETHYLENE GLYCOL 3350 17 G PO PACK
17.0000 g | PACK | Freq: Every day | ORAL | Status: DC
  Administered 2023-04-30 – 2023-05-04 (×5): 17 g via ORAL
  Filled 2023-04-30 (×5): qty 1

## 2023-04-30 NOTE — TOC Progression Note (Signed)
Transition of Care [facility]) - Progression Note    Patient Details  Name: TARL MARKSBERRY MRN: [id number] Date of Birth: 1971-11-08  Transition of Care [facility]) CM/SW Contact  Baldemar Lenis, [place] Phone Number: 04/30/2023, 1:56 PM  Clinical Narrative:   CSW contacted [facility] to ask about referral, they are out of network with patient's insurance. CSW faxed out referral further. CSW contacted cousin Nelva Bush to discuss, she indicated understanding. CSW to follow.    Expected Discharge Plan: Skilled Nursing Facility Barriers to Discharge: Continued Medical Work up, English as a second language teacher, Inadequate or no insurance, Active Substance Use - Placement  Expected Discharge Plan and Services     Post Acute Care Choice: IP Rehab Living arrangements for the past 2 months: Single Family Home                                       Social Determinants of Health (SDOH) Interventions SDOH Screenings   Food Insecurity: No Food Insecurity (04/30/2023)  Housing: Low Risk  (04/30/2023)  Transportation Needs: No Transportation Needs (04/30/2023)  Utilities: Not At Risk (04/30/2023)  Tobacco Use: Medium Risk (04/30/2023)    Readmission Risk Interventions     No data to display
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
## 2023-05-01 DIAGNOSIS — F1010 Alcohol abuse, uncomplicated: Secondary | ICD-10-CM | POA: Diagnosis not present

## 2023-05-01 DIAGNOSIS — I63321 Cerebral infarction due to thrombosis of right anterior cerebral artery: Secondary | ICD-10-CM

## 2023-05-01 DIAGNOSIS — F1410 Cocaine abuse, uncomplicated: Secondary | ICD-10-CM | POA: Diagnosis not present

## 2023-05-01 DIAGNOSIS — F17200 Nicotine dependence, unspecified, uncomplicated: Secondary | ICD-10-CM
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
## 2023-05-01 NOTE — Plan of Care (Signed)

## 2023-05-01 NOTE — TOC Progression Note (Signed)
Transition of Care [facility]) - Progression Note    Patient Details  Name: Adam Mcgrath MRN: [id number] Date of Birth: 1971/10/26  Transition of Care [facility]) CM/SW Contact  Baldemar Lenis, [place] Phone Number: 05/01/2023, 2:01 PM  Clinical Narrative:   Patient continues to have no bed offers. CSW faxed referral out further. CSW to follow.    Expected Discharge Plan: Skilled Nursing Facility Barriers to Discharge: Continued Medical Work up, English as a second language teacher, Inadequate or no insurance, Active Substance Use - Placement  Expected Discharge Plan and Services     Post Acute Care Choice: IP Rehab Living arrangements for the past 2 months: Single Family Home                                       Social Determinants of Health (SDOH) Interventions SDOH Screenings   Food Insecurity: No Food Insecurity (04/30/2023)  Housing: Low Risk  (04/30/2023)  Transportation Needs: No Transportation Needs (04/30/2023)  Utilities: Not At Risk (04/30/2023)  Tobacco Use: Medium Risk (04/30/2023)    Readmission Risk Interventions     No data to display
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
## 2023-05-02 DIAGNOSIS — S069XAD Unspecified intracranial injury with loss of consciousness status unknown, subsequent encounter: Secondary | ICD-10-CM
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
## 2023-05-02 NOTE — TOC Progression Note (Signed)
Transition of Care [facility]) - Progression Note    Patient Details  Name: Adam Mcgrath MRN: [id number] Date of Birth: 1971/03/25  [facility] CM/SW Contact  Kermit Balo, RN Phone Number: 05/02/2023, 3:36 PM  Clinical Narrative:    Pt ambulated 300 feet today and recs have changed to [facility] services. CM most likely will not be able to arrange [facility] with his Medicaid.  CM spoke to pts cousin and she says he is getting closer to his baseline. They manage his finances. They do his shopping. He most likely wont take medications at home. Cousin says he never has been willing to take medications. She says she will set him up a pill box if he does agree to take them.  Cousins spouse will provide transportation to appts as needed.  MD updated and CM following for d/c needs.    Expected Discharge Plan: Skilled Nursing Facility Barriers to Discharge: Continued Medical Work up, English as a second language teacher, Inadequate or no insurance, Active Substance Use - Placement  Expected Discharge Plan and Services     Post Acute Care Choice: IP Rehab Living arrangements for the past 2 months: Single Family Home                                       Social Determinants of Health (SDOH) Interventions SDOH Screenings   Food Insecurity: No Food Insecurity (04/30/2023)  Housing: Low Risk  (04/30/2023)  Transportation Needs: No Transportation Needs (04/30/2023)  Utilities: Not At Risk (04/30/2023)  Tobacco Use: Medium Risk (04/30/2023)    Readmission Risk Interventions     No data to display
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
## 2023-05-03 DIAGNOSIS — S060X9D Concussion with loss of consciousness of unspecified duration, subsequent encounter: Secondary | ICD-10-CM | POA: Diagnosis not present
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
## 2023-05-03 NOTE — Progress Notes (Signed)
Dressing changed on left arm. Cleanse with normal saline. Covered with non adherent gauze

## 2023-05-03 NOTE — Progress Notes (Signed)
Mobility Specialist Progress Note   05/03/23 1731  Mobility  Activity Ambulated with assistance in hallway  Level of Assistance Contact guard assist, steadying assist  Assistive Device None  Distance Ambulated (ft) 300 ft  Activity Response Tolerated well  Mobility Referral Yes  $Mobility charge 1 Mobility   Received pt in bed expressing they've had better days but when asked to clarify they couldn't answer. Pt agreeable to mobility and requiring no physical assist for bed mobility but CGA during ambulation d/t slight sway. No faults throughout, returned back to bed w/ all needs met, call bell in reach and bed alarm on.   Frederico Hamman Mobility Specialist Please contact via SecureChat or  Rehab office at [phone]

## 2023-05-04 ENCOUNTER — Other Ambulatory Visit (HOSPITAL_COMMUNITY): Payer: Self-pay

## 2023-05-04 DIAGNOSIS — T07XXXA Unspecified multiple injuries, initial encounter: Secondary | ICD-10-CM | POA: Diagnosis not present

## 2023-05-04 DIAGNOSIS — S069XAD Unspecified intracranial injury with loss of consciousness status unknown, subsequent encounter: Secondary | ICD-10-CM | POA: Diagnosis not present

## 2023-05-04 MED ORDER — ROSUVASTATIN CALCIUM 10 MG PO TABS
10.0000 mg | ORAL_TABLET | Freq: Every day | ORAL | 2 refills | Status: AC
  Filled 2023-05-04: qty 30, 30d supply, fill #0

## 2023-05-04 MED ORDER — POLYETHYLENE GLYCOL 3350 17 GM/SCOOP PO POWD
17.0000 g | Freq: Every day | ORAL | 0 refills | Status: AC
  Filled 2023-05-04: qty 238, 14d supply, fill #0

## 2023-05-04 MED ORDER — ASPIRIN 81 MG PO TBEC
81.0000 mg | DELAYED_RELEASE_TABLET | Freq: Every day | ORAL | 12 refills | Status: AC
  Filled 2023-05-04: qty 120, 120d supply, fill #0

## 2023-05-04 MED ORDER — THIAMINE HCL 100 MG PO TABS
100.0000 mg | ORAL_TABLET | Freq: Every day | ORAL | 2 refills | Status: AC
  Filled 2023-05-04: qty 30, 30d supply, fill #0

## 2023-05-04 MED ORDER — MAGNESIUM GLUCONATE 500 MG PO TABS
500.0000 mg | ORAL_TABLET | Freq: Two times a day (BID) | ORAL | 3 refills | Status: AC
  Filled 2023-05-04: qty 30, 15d supply, fill #0

## 2023-05-04 MED ORDER — NICOTINE 21 MG/24HR TD PT24
21.0000 mg | MEDICATED_PATCH | Freq: Every day | TRANSDERMAL | 0 refills | Status: AC
  Filled 2023-05-04: qty 28, 28d supply, fill #0

## 2023-05-04 MED ORDER — CYANOCOBALAMIN 1000 MCG PO TABS
1000.0000 ug | ORAL_TABLET | Freq: Every day | ORAL | 12 refills | Status: AC
  Filled 2023-05-04: qty 130, 130d supply, fill #0

## 2023-05-04 MED ORDER — FOLIC ACID 1 MG PO TABS
1.0000 mg | ORAL_TABLET | Freq: Every day | ORAL | 2 refills | Status: AC
  Filled 2023-05-04: qty 30, 30d supply, fill #0

## 2023-05-04 MED ORDER — ADULT MULTIVITAMIN W/MINERALS CH
1.0000 | ORAL_TABLET | Freq: Every day | ORAL | 3 refills | Status: AC
  Filled 2023-05-04: qty 130, 130d supply, fill #0

## 2023-05-04 MED ORDER — CLOPIDOGREL BISULFATE 75 MG PO TABS
75.0000 mg | ORAL_TABLET | Freq: Every day | ORAL | 0 refills | Status: AC
  Filled 2023-05-04: qty 18, 18d supply, fill #0

## 2023-05-04 NOTE — TOC Transition Note (Signed)
Transition of Care [facility]) - CM/SW Discharge Note   Patient Details  Name: Adam Mcgrath MRN: [id number] Date of Birth: 11/21/1971  [facility] CM/SW Contact:  Kermit Balo, RN Phone Number: 05/04/2023, 1:19 PM   Clinical Narrative:    Pt is discharging home with self care. CM unable to arrange [facility] with his medicaid. Per his cousin, pt is close to his baseline. They assist him at home with shopping, meds, transportation. Cousin will provide transportation home today. Medications for home to be delivered to the room per [facility] pharmacy.   Final next level of care: Home/Self Care Barriers to Discharge: No Barriers Identified   Patient Goals and CMS Choice CMS Medicare.gov Compare Post Acute Care list provided to:: Patient Choice offered to / list presented to : Patient  Discharge Placement                         Discharge Plan and Services Additional resources added to the After Visit Summary for       Post Acute Care Choice: IP Rehab                               Social Determinants of Health (SDOH) Interventions SDOH Screenings   Food Insecurity: No Food Insecurity (04/30/2023)  Housing: Low Risk  (04/30/2023)  Transportation Needs: No Transportation Needs (04/30/2023)  Utilities: Not At Risk (04/30/2023)  Tobacco Use: Medium Risk (04/30/2023)     Readmission Risk Interventions     No data to display
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
## 2023-05-04 NOTE — Discharge Instructions (Signed)
Discharge instructions given pt and family members regarding discharge medications, Ischemic Stroke,  BEFAST.

## 2023-05-04 NOTE — TOC CAGE-AID Note (Signed)
Transition of Care [facility]) - CAGE-AID Screening   Patient Details  Name: Adam Mcgrath MRN: [id number] Date of Birth: Sep 16, 1971  [facility] CM/SW Contact:    Kermit Balo, RN Phone Number: 05/04/2023, 1:52 PM   Clinical Narrative: Pt refused resources for inpatient/ outpatient drug/ alcohol counseling.   CAGE-AID Screening: Substance Abuse Screening unable to be completed due to: : Patient unable to participate  Have You Ever Felt You Ought to Cut Down on Your Drinking or Drug Use?: No Have People Annoyed You By Critizing Your Drinking Or Drug Use?: No Have You Felt Bad Or Guilty About Your Drinking Or Drug Use?: No Have You Ever Had a Drink or Used Drugs First Thing In The Morning to Steady Your Nerves or to Get Rid of a Hangover?: No CAGE-AID Score: 0  Substance Abuse Education Offered: Yes (refused)

## 2023-05-04 NOTE — Progress Notes (Signed)
   05/04/23 1200  Spiritual Encounters  Type of Visit Initial  Care provided to: Patient  Referral source Patient request  Reason for visit Advance directives  OnCall Visit No   Ch responded to request for AD. Family was not present at bedside but I had a conversation over the phone with pt's cousin. She stated that pt does not know how to read or write. But he is able to sign documents. Ch assisted pt with AD education and filling out paperwork. Ch will help pt complete AD this afternoon.

## 2023-05-21 ENCOUNTER — Telehealth: Payer: Self-pay

## 2023-05-21 NOTE — Telephone Encounter (Signed)
Received referral from [facility] at [facility] on 5/13. Patient has Medicaid benefits and will not qualify for [facility] program. I gave the family member our number and also referred them to the [facility] "[employer]" if they need any additional help finding a PCP.

## 2023-09-11 ENCOUNTER — Other Ambulatory Visit (HOSPITAL_COMMUNITY): Payer: Self-pay

## 2024-07-04 ENCOUNTER — Encounter: Payer: Self-pay | Admitting: Advanced Practice Midwife
